# Patient Record
Sex: Female | Born: 1980 | Race: White | Hispanic: No | Marital: Single | Smoking: Former smoker
Health system: Southern US, Community
[De-identification: ages and names within clinical notes are randomized; demographics above are authoritative.]

## PROBLEM LIST (undated history)

## (undated) DIAGNOSIS — F419 Anxiety disorder, unspecified: Secondary | ICD-10-CM

## (undated) DIAGNOSIS — IMO0002 Reserved for concepts with insufficient information to code with codable children: Secondary | ICD-10-CM

## (undated) DIAGNOSIS — D649 Anemia, unspecified: Secondary | ICD-10-CM

## (undated) DIAGNOSIS — F329 Major depressive disorder, single episode, unspecified: Secondary | ICD-10-CM

## (undated) DIAGNOSIS — Z8489 Family history of other specified conditions: Secondary | ICD-10-CM

## (undated) DIAGNOSIS — R569 Unspecified convulsions: Secondary | ICD-10-CM

## (undated) DIAGNOSIS — J189 Pneumonia, unspecified organism: Secondary | ICD-10-CM

## (undated) DIAGNOSIS — F32A Depression, unspecified: Secondary | ICD-10-CM

## (undated) DIAGNOSIS — B351 Tinea unguium: Secondary | ICD-10-CM

## (undated) DIAGNOSIS — R519 Headache, unspecified: Secondary | ICD-10-CM

## (undated) DIAGNOSIS — K219 Gastro-esophageal reflux disease without esophagitis: Secondary | ICD-10-CM

## (undated) DIAGNOSIS — K5900 Constipation, unspecified: Secondary | ICD-10-CM

## (undated) HISTORY — PX: LEEP: SHX91

---

## 2004-04-01 ENCOUNTER — Emergency Department: Payer: Self-pay | Admitting: Emergency Medicine

## 2004-07-29 ENCOUNTER — Ambulatory Visit: Payer: Self-pay | Admitting: Physical Medicine & Rehabilitation

## 2006-12-09 ENCOUNTER — Other Ambulatory Visit: Payer: Self-pay

## 2006-12-09 ENCOUNTER — Emergency Department: Payer: Self-pay | Admitting: Emergency Medicine

## 2007-08-14 ENCOUNTER — Emergency Department: Payer: Self-pay | Admitting: Emergency Medicine

## 2007-11-28 ENCOUNTER — Emergency Department: Payer: Self-pay | Admitting: Emergency Medicine

## 2010-11-28 ENCOUNTER — Emergency Department: Payer: Self-pay | Admitting: Emergency Medicine

## 2011-08-26 ENCOUNTER — Emergency Department: Payer: Self-pay | Admitting: *Deleted

## 2011-08-26 LAB — URINALYSIS, COMPLETE
Bilirubin,UR: NEGATIVE
Blood: NEGATIVE
Glucose,UR: NEGATIVE mg/dL (ref 0–75)
Ketone: NEGATIVE
Leukocyte Esterase: NEGATIVE
Nitrite: NEGATIVE
Ph: 6 (ref 4.5–8.0)
Protein: NEGATIVE
RBC,UR: NONE SEEN /HPF (ref 0–5)
Specific Gravity: 1.027 (ref 1.003–1.030)
Squamous Epithelial: 9
WBC UR: NONE SEEN /HPF (ref 0–5)

## 2011-08-26 LAB — CBC WITH DIFFERENTIAL/PLATELET
Basophil %: 0.9 %
Eosinophil #: 0.5 10*3/uL (ref 0.0–0.7)
HGB: 13.7 g/dL (ref 12.0–16.0)
MCHC: 33.3 g/dL (ref 32.0–36.0)
Monocyte #: 0.5 x10 3/mm (ref 0.2–0.9)
Monocyte %: 4.5 %
Neutrophil #: 7.2 10*3/uL — ABNORMAL HIGH (ref 1.4–6.5)
Neutrophil %: 65.9 %
Platelet: 340 10*3/uL (ref 150–440)
RBC: 4.77 10*6/uL (ref 3.80–5.20)
RDW: 13.7 % (ref 11.5–14.5)

## 2011-08-26 LAB — BASIC METABOLIC PANEL
Calcium, Total: 8.8 mg/dL (ref 8.5–10.1)
Chloride: 103 mmol/L (ref 98–107)
EGFR (Non-African Amer.): >60

## 2011-08-26 LAB — PREGNANCY, URINE: Pregnancy Test, Urine: NEGATIVE m[IU]/mL

## 2014-07-09 DIAGNOSIS — R87613 High grade squamous intraepithelial lesion on cytologic smear of cervix (HGSIL): Secondary | ICD-10-CM | POA: Insufficient documentation

## 2014-07-09 DIAGNOSIS — K5909 Other constipation: Secondary | ICD-10-CM | POA: Insufficient documentation

## 2014-07-09 DIAGNOSIS — N930 Postcoital and contact bleeding: Secondary | ICD-10-CM | POA: Insufficient documentation

## 2014-07-09 DIAGNOSIS — N941 Unspecified dyspareunia: Secondary | ICD-10-CM | POA: Insufficient documentation

## 2014-11-03 ENCOUNTER — Other Ambulatory Visit: Payer: Self-pay | Admitting: Nurse Practitioner

## 2014-11-03 DIAGNOSIS — G8929 Other chronic pain: Secondary | ICD-10-CM

## 2014-11-03 DIAGNOSIS — R1013 Epigastric pain: Secondary | ICD-10-CM

## 2014-11-03 DIAGNOSIS — K5900 Constipation, unspecified: Secondary | ICD-10-CM

## 2014-11-03 DIAGNOSIS — R197 Diarrhea, unspecified: Secondary | ICD-10-CM

## 2014-11-06 ENCOUNTER — Ambulatory Visit: Admission: RE | Admit: 2014-11-06 | Payer: PRIVATE HEALTH INSURANCE | Source: Ambulatory Visit

## 2014-12-15 ENCOUNTER — Ambulatory Visit
Admission: RE | Admit: 2014-12-15 | Discharge: 2014-12-15 | Disposition: A | Discharge status: Home / Self Care | Payer: PRIVATE HEALTH INSURANCE | Source: Ambulatory Visit | Attending: Nurse Practitioner | Admitting: Nurse Practitioner

## 2014-12-15 DIAGNOSIS — R198 Other specified symptoms and signs involving the digestive system and abdomen: Secondary | ICD-10-CM | POA: Diagnosis present

## 2014-12-15 DIAGNOSIS — R1013 Epigastric pain: Secondary | ICD-10-CM | POA: Diagnosis present

## 2014-12-15 DIAGNOSIS — G8929 Other chronic pain: Secondary | ICD-10-CM | POA: Diagnosis not present

## 2014-12-15 DIAGNOSIS — R1030 Lower abdominal pain, unspecified: Secondary | ICD-10-CM | POA: Diagnosis not present

## 2014-12-15 DIAGNOSIS — K5900 Constipation, unspecified: Secondary | ICD-10-CM

## 2014-12-15 DIAGNOSIS — R197 Diarrhea, unspecified: Secondary | ICD-10-CM

## 2014-12-15 MED ORDER — IOHEXOL 300 MG/ML  SOLN
100.0000 mL | Freq: Once | INTRAMUSCULAR | Status: AC | PRN
  Administered 2014-12-15: 100 mL via INTRAVENOUS

## 2014-12-19 ENCOUNTER — Encounter: Payer: Self-pay | Admitting: *Deleted

## 2014-12-22 ENCOUNTER — Ambulatory Visit
Admission: RE | Admit: 2014-12-22 | Discharge: 2014-12-22 | Disposition: A | Discharge status: Home / Self Care | Payer: No Typology Code available for payment source | Source: Ambulatory Visit | Attending: Gastroenterology | Admitting: Gastroenterology

## 2014-12-22 ENCOUNTER — Ambulatory Visit: Payer: No Typology Code available for payment source | Admitting: Anesthesiology

## 2014-12-22 ENCOUNTER — Encounter: Payer: Self-pay | Admitting: *Deleted

## 2014-12-22 ENCOUNTER — Encounter
Admission: RE | Disposition: A | Discharge status: Home / Self Care | Payer: Self-pay | Source: Ambulatory Visit | Attending: Gastroenterology

## 2014-12-22 DIAGNOSIS — Z87891 Personal history of nicotine dependence: Secondary | ICD-10-CM | POA: Insufficient documentation

## 2014-12-22 DIAGNOSIS — F329 Major depressive disorder, single episode, unspecified: Secondary | ICD-10-CM | POA: Insufficient documentation

## 2014-12-22 DIAGNOSIS — Z79899 Other long term (current) drug therapy: Secondary | ICD-10-CM | POA: Diagnosis not present

## 2014-12-22 DIAGNOSIS — R1013 Epigastric pain: Secondary | ICD-10-CM | POA: Diagnosis not present

## 2014-12-22 DIAGNOSIS — K5900 Constipation, unspecified: Secondary | ICD-10-CM | POA: Insufficient documentation

## 2014-12-22 DIAGNOSIS — Z8371 Family history of colonic polyps: Secondary | ICD-10-CM | POA: Insufficient documentation

## 2014-12-22 DIAGNOSIS — K219 Gastro-esophageal reflux disease without esophagitis: Secondary | ICD-10-CM | POA: Insufficient documentation

## 2014-12-22 DIAGNOSIS — Z800 Family history of malignant neoplasm of digestive organs: Secondary | ICD-10-CM | POA: Insufficient documentation

## 2014-12-22 DIAGNOSIS — N941 Dyspareunia: Secondary | ICD-10-CM | POA: Insufficient documentation

## 2014-12-22 DIAGNOSIS — F419 Anxiety disorder, unspecified: Secondary | ICD-10-CM | POA: Diagnosis not present

## 2014-12-22 HISTORY — DX: Major depressive disorder, single episode, unspecified: F32.9

## 2014-12-22 HISTORY — DX: Reserved for concepts with insufficient information to code with codable children: IMO0002

## 2014-12-22 HISTORY — DX: Unspecified convulsions: R56.9

## 2014-12-22 HISTORY — PX: ESOPHAGOGASTRODUODENOSCOPY (EGD) WITH PROPOFOL: SHX5813

## 2014-12-22 HISTORY — DX: Anxiety disorder, unspecified: F41.9

## 2014-12-22 HISTORY — DX: Depression, unspecified: F32.A

## 2014-12-22 HISTORY — PX: COLONOSCOPY: SHX5424

## 2014-12-22 HISTORY — DX: Constipation, unspecified: K59.00

## 2014-12-22 LAB — POCT PREGNANCY, URINE: PREG TEST UR: NEGATIVE

## 2014-12-22 SURGERY — COLONOSCOPY
Anesthesia: General

## 2014-12-22 MED ORDER — GLYCOPYRROLATE 0.2 MG/ML IJ SOLN
INTRAMUSCULAR | Status: DC | PRN
  Administered 2014-12-22: 0.2 mg via INTRAVENOUS

## 2014-12-22 MED ORDER — SODIUM CHLORIDE 0.9 % IV SOLN
INTRAVENOUS | Status: DC
Start: 2014-12-22 — End: 2014-12-22
  Administered 2014-12-22: 13:00:00 via INTRAVENOUS

## 2014-12-22 MED ORDER — PROPOFOL INFUSION 10 MG/ML OPTIME
INTRAVENOUS | Status: DC | PRN
  Administered 2014-12-22: 250 ug/kg/min via INTRAVENOUS

## 2014-12-22 MED ORDER — LIDOCAINE HCL (CARDIAC) 20 MG/ML IV SOLN
INTRAVENOUS | Status: DC | PRN
  Administered 2014-12-22: 100 mg via INTRAVENOUS

## 2014-12-22 MED ORDER — PROPOFOL 10 MG/ML IV BOLUS
INTRAVENOUS | Status: DC | PRN
  Administered 2014-12-22 (×3): 50 mg via INTRAVENOUS

## 2014-12-22 MED ORDER — FENTANYL CITRATE (PF) 100 MCG/2ML IJ SOLN
INTRAMUSCULAR | Status: DC | PRN
  Administered 2014-12-22: 50 ug via INTRAVENOUS

## 2014-12-22 MED ORDER — MIDAZOLAM HCL 2 MG/2ML IJ SOLN
INTRAMUSCULAR | Status: DC | PRN
  Administered 2014-12-22: 2 mg via INTRAVENOUS

## 2014-12-22 MED ORDER — SODIUM CHLORIDE 0.9 % IV SOLN: INTRAVENOUS | Status: DC

## 2014-12-22 NOTE — Op Note (Addendum)
[facility] Gastroenterology Patient Name: Janet Summers Procedure Date: 12/22/2014 12:34 PM MRN: [id number] Account #: [id number] Date of Birth: 10-26-1980 Admit Type: Outpatient Age: 34 Room: [facility] ENDO ROOM 4 Gender: Female Note Status: Supervisor Override Procedure:         Colonoscopy Indications:       Family history of colon cancer in a first-degree relative,                     Family history of colonic polyps in a first-degree                     relative, Change in bowel habits Providers:         Ezzard Standing. Bluford Kaufmann, MD Referring MD:      Sallye Lat Md, MD (Referring MD) Medicines:         Monitored Anesthesia Care Complications:     No immediate complications. Procedure:         Pre-Anesthesia Assessment:                    - Prior to the procedure, a History and Physical was                     performed, and patient medications, allergies and                     sensitivities were reviewed. The patient's tolerance of                     previous anesthesia was reviewed.                    - The risks and benefits of the procedure and the sedation                     options and risks were discussed with the patient. All                     questions were answered and informed consent was obtained.                    After obtaining informed consent, the colonoscope was                     passed under direct vision. Throughout the procedure, the                     patient's blood pressure, pulse, and oxygen saturations                     were monitored continuously. The Colonoscope was                     introduced through the anus and advanced to the the                     terminal ileum, with identification of the appendiceal                     orifice and IC valve. The colonoscopy was performed                     without difficulty. The patient tolerated the procedure  well. The quality of the bowel preparation was good. Findings:   The colon (entire examined portion) appeared normal. Biopsies for       histology were taken with a cold forceps from the entire colon for       evaluation of microscopic colitis.      The terminal ileum appeared normal. Biopsies were taken with a cold       forceps for histology. Impression:        - The entire examined colon is normal. Biopsied.                    - The examined portion of the ileum was normal. Biopsied. Recommendation:    - Discharge patient to home.                    - Await pathology results.                    - Repeat colonoscopy in 10 years for surveillance.                    - The findings and recommendations were discussed with the                     patient. Procedure Code(s): --- Professional ---                    [phone], Colonoscopy, flexible; with biopsy, single or                     multiple Diagnosis Code(s): --- Professional ---                    Z80.0, Family history of malignant neoplasm of digestive                     organs                    Z83.71, Family history of colonic polyps                    R19.4, Change in bowel habit CPT copyright 2014 American Medical Association. All rights reserved. The codes documented in this report are preliminary and upon coder review may  be revised to meet current compliance requirements. Wallace Cullens, MD 12/22/2014 1:45:54 PM This report has been signed electronically. Number of Addenda: 0 Note Initiated On: 12/22/2014 12:34 PM Scope Withdrawal Time: 0 hours 4 minutes 29 seconds  Total Procedure Duration: 0 hours 8 minutes 58 seconds       [facility]

## 2014-12-22 NOTE — Anesthesia Preprocedure Evaluation (Signed)
Anesthesia Evaluation  Patient identified by MRN, date of birth, ID band Patient awake    Reviewed: Allergy & Precautions, H&P , NPO status , Patient's Chart, lab work & pertinent test results, reviewed documented beta blocker date and time   History of Anesthesia Complications Negative for: history of anesthetic complications  Airway Mallampati: III  TM Distance: >3 FB Neck ROM: full    Dental no notable dental hx. (+) Partial Upper, Missing   Pulmonary neg pulmonary ROS, former smoker,    Pulmonary exam normal breath sounds clear to auscultation       Cardiovascular Exercise Tolerance: Good negative cardio ROS Normal cardiovascular exam Rhythm:regular Rate:Normal     Neuro/Psych Seizures - (last one was 3 years ago), Well Controlled,  PSYCHIATRIC DISORDERS (depression and anxiety)    GI/Hepatic Neg liver ROS, GERD  Medicated and Controlled,  Endo/Other  negative endocrine ROS  Renal/GU negative Renal ROS  negative genitourinary   Musculoskeletal   Abdominal   Peds  Hematology negative hematology ROS (+)   Anesthesia Other Findings Past Medical History:   Seizures                                                     Depression                                                   Anxiety                                                      Constipation                                                 Dyspareunia                                                  Reproductive/Obstetrics negative OB ROS                             Anesthesia Physical Anesthesia Plan  ASA: II  Anesthesia Plan: General   Post-op Pain Management:    Induction:   Airway Management Planned:   Additional Equipment:   Intra-op Plan:   Post-operative Plan:   Informed Consent: I have reviewed the patients History and Physical, chart, labs and discussed the procedure including the risks, benefits and  alternatives for the proposed anesthesia with the patient or authorized representative who has indicated his/her understanding and acceptance.   Dental Advisory Given  Plan Discussed with: Anesthesiologist, CRNA and Surgeon  Anesthesia Plan Comments:         Anesthesia Quick Evaluation

## 2014-12-22 NOTE — Transfer of Care (Signed)
Immediate Anesthesia Transfer of Care Note  Patient: Janet Summers  Procedure(s) Performed: Procedure(s): COLONOSCOPY (N/A) ESOPHAGOGASTRODUODENOSCOPY (EGD) WITH PROPOFOL (N/A)  Patient Location: PACU  Anesthesia Type:General  Level of Consciousness: awake  Airway & Oxygen Therapy: Patient Spontanous Breathing and Patient connected to nasal cannula oxygen  Post-op Assessment: Report given to RN and Post -op Vital signs reviewed and stable  Post vital signs: Reviewed and stable  Last Vitals:  Filed Vitals:   12/22/14 1349  BP: 103/70  Pulse: 79  Temp: 36.1 C  Resp: 21    Complications: No apparent anesthesia complications

## 2014-12-22 NOTE — Op Note (Signed)
[facility] Gastroenterology Patient Name: Janet Summers Procedure Date: 12/22/2014 12:35 PM MRN: [id number] Account #: [id number] Date of Birth: 07-19-1980 Admit Type: Outpatient Age: 33 Room: [facility] ENDO ROOM 4 Gender: Female Note Status: Finalized Procedure:         Upper GI endoscopy Indications:       Epigastric abdominal pain, Suspected gastro-esophageal                     reflux disease, Symptoms bettr after starting prilosec. Providers:         Ezzard Standing. Bluford Kaufmann, MD Referring MD:      Sallye Lat Md, MD (Referring MD) Medicines:         Monitored Anesthesia Care Complications:     No immediate complications. Procedure:         Pre-Anesthesia Assessment:                    - Prior to the procedure, a History and Physical was                     performed, and patient medications, allergies and                     sensitivities were reviewed. The patient's tolerance of                     previous anesthesia was reviewed.                    - The risks and benefits of the procedure and the sedation                     options and risks were discussed with the patient. All                     questions were answered and informed consent was obtained.                    - After reviewing the risks and benefits, the patient was                     deemed in satisfactory condition to undergo the procedure.                    After obtaining informed consent, the endoscope was passed                     under direct vision. Throughout the procedure, the                     patient's blood pressure, pulse, and oxygen saturations                     were monitored continuously. The Olympus GIF-160 endoscope                     (S#. [id number]) was introduced through the mouth, and                     advanced to the second part of duodenum. The upper GI                     endoscopy was accomplished without difficulty. The patient  tolerated the procedure  well. Findings:      The examined esophagus was normal.      The entire examined stomach was normal.      The examined duodenum was normal. Impression:        - Normal esophagus.                    - Normal stomach.                    - Normal examined duodenum.                    - No specimens collected. Recommendation:    - Discharge patient to home.                    - Observe patient's clinical course.                    - Continue present medications.                    - The findings and recommendations were discussed with the                     patient. Procedure Code(s): --- Professional ---                    [phone], Esophagogastroduodenoscopy, flexible, transoral;                     diagnostic, including collection of specimen(s) by                     brushing or washing, when performed (separate procedure) Diagnosis Code(s): --- Professional ---                    R10.13, Epigastric pain CPT copyright 2014 American Medical Association. All rights reserved. The codes documented in this report are preliminary and upon coder review may  be revised to meet current compliance requirements. Wallace Cullens, MD 12/22/2014 1:32:51 PM This report has been signed electronically. Number of Addenda: 0 Note Initiated On: 12/22/2014 12:35 PM      [facility]

## 2014-12-22 NOTE — H&P (Signed)
    Primary Care Physician:  Cyndie Mull, DO Primary Gastroenterologist:  Dr. Bluford Kaufmann  Pre-Procedure History & Physical: HPI:  Janet Summers is a 33 y.o. female is here for an EGD/colonoscopy.   Past Medical History  Diagnosis Date  . Seizures   . Depression   . Anxiety   . Constipation   . Dyspareunia     Past Surgical History  Procedure Laterality Date  . Leep      Prior to Admission medications   Medication Sig Start Date End Date Taking? Authorizing Provider  omeprazole (PRILOSEC OTC) 20 MG tablet Take 20 mg by mouth daily.   Yes Historical Provider, MD    Allergies as of 11/20/2014  . (Not on File)    History reviewed. No pertinent family history.  Social History   Social History  . Marital Status: Married    Spouse Name: N/A  . Number of Children: N/A  . Years of Education: N/A   Occupational History  . Not on file.   Social History Main Topics  . Smoking status: Former Games developer  . Smokeless tobacco: Never Used  . Alcohol Use: No  . Drug Use: No  . Sexual Activity: Not on file   Other Topics Concern  . Not on file   Social History Narrative    Review of Systems: See HPI, otherwise negative ROS  Physical Exam: BP 111/67 mmHg  Pulse 71  Temp(Src) 97.4 F (36.3 C) (Tympanic)  Resp 10  Ht  (1.626 m)  Wt 86.183 kg (190 lb)  BMI 32.60 kg/m2  SpO2 100%  LMP  General:   Alert,  pleasant and cooperative in NAD Head:  Normocephalic and atraumatic. Neck:  Supple; no masses or thyromegaly. Lungs:  Clear throughout to auscultation.    Heart:  Regular rate and rhythm. Abdomen:  Soft, nontender and nondistended. Normal bowel sounds, without guarding, and without rebound.   Neurologic:  Alert and  oriented x4;  grossly normal neurologically.  Impression/Plan: Janet Summers is here for an EGD/colonoscopy to be performed for family hx of colon cancer/polyps, GERD, and epigastric pain.  Risks, benefits, limitations, and alternatives  regarding EGD/colonoscopy have been reviewed with the patient.  Questions have been answered.  All parties agreeable.   OH, Ezzard Standing, MD  12/22/2014, 1:22 PM

## 2014-12-23 LAB — SURGICAL PATHOLOGY

## 2014-12-23 NOTE — Anesthesia Postprocedure Evaluation (Signed)
  Anesthesia Post-op Note  Patient: Janet Summers  Procedure(s) Performed: Procedure(s): COLONOSCOPY (N/A) ESOPHAGOGASTRODUODENOSCOPY (EGD) WITH PROPOFOL (N/A)  Anesthesia type:General  Patient location: PACU  Post pain: Pain level controlled  Post assessment: Post-op Vital signs reviewed, Patient's Cardiovascular Status Stable, Respiratory Function Stable, Patent Airway and No signs of Nausea or vomiting  Post vital signs: Reviewed and stable  Last Vitals:  Filed Vitals:   12/22/14 1419  BP: 118/78  Pulse: 74  Temp:   Resp: 15    Level of consciousness: awake, alert  and patient cooperative  Complications: No apparent anesthesia complications

## 2014-12-24 ENCOUNTER — Encounter: Payer: Self-pay | Admitting: Gastroenterology

## 2015-05-25 ENCOUNTER — Encounter: Payer: Self-pay | Admitting: Emergency Medicine

## 2015-05-25 ENCOUNTER — Ambulatory Visit
Admission: EM | Admit: 2015-05-25 | Discharge: 2015-05-25 | Disposition: A | Discharge status: Home / Self Care | Payer: BLUE CROSS/BLUE SHIELD | Attending: Family Medicine | Admitting: Family Medicine

## 2015-05-25 DIAGNOSIS — B349 Viral infection, unspecified: Secondary | ICD-10-CM

## 2015-05-25 LAB — RAPID INFLUENZA A&B ANTIGENS
Influenza A (ARMC): NOT DETECTED
Influenza B (ARMC): NOT DETECTED

## 2015-05-25 MED ORDER — LORATADINE-PSEUDOEPHEDRINE ER 5-120 MG PO TB12: 1.0000 | ORAL_TABLET | Freq: Two times a day (BID) | ORAL | Status: DC

## 2015-05-25 MED ORDER — OSELTAMIVIR PHOSPHATE 75 MG PO CAPS: 75.0000 mg | ORAL_CAPSULE | Freq: Two times a day (BID) | ORAL | Status: DC

## 2015-05-25 NOTE — Discharge Instructions (Signed)
Take medication as prescribed. Rest. Drink plenty of fluids. Take over the counter tylenol or ibuprofen as needed for pain or fever.  ° °Follow up with your primary care physician this week as needed. Return to Urgent care for new or worsening concerns.  ° °Viral Infections °A viral infection can be caused by different types of viruses. Most viral infections are not serious and resolve on their own. However, some infections may cause severe symptoms and may lead to further complications. °SYMPTOMS °Viruses can frequently cause: °· Minor sore throat. °· Aches and pains. °· Headaches. °· Runny nose. °· Different types of rashes. °· Watery eyes. °· Tiredness. °· Cough. °· Loss of appetite. °· Gastrointestinal infections, resulting in nausea, vomiting, and diarrhea. °These symptoms do not respond to antibiotics because the infection is not caused by bacteria. However, you might catch a bacterial infection following the viral infection. This is sometimes called a "superinfection." Symptoms of such a bacterial infection may include: °· Worsening sore throat with pus and difficulty swallowing. °· Swollen neck glands. °· Chills and a high or persistent fever. °· Severe headache. °· Tenderness over the sinuses. °· Persistent overall ill feeling (malaise), muscle aches, and tiredness (fatigue). °· Persistent cough. °· Yellow, green, or brown mucus production with coughing. °HOME CARE INSTRUCTIONS  °· Only take over-the-counter or prescription medicines for pain, discomfort, diarrhea, or fever as directed by your caregiver. °· Drink enough water and fluids to keep your urine clear or pale yellow. Sports drinks can provide valuable electrolytes, sugars, and hydration. °· Get plenty of rest and maintain proper nutrition. Soups and broths with crackers or rice are fine. °SEEK IMMEDIATE MEDICAL CARE IF:  °· You have severe headaches, shortness of breath, chest pain, neck pain, or an unusual rash. °· You have uncontrolled vomiting,  diarrhea, or you are unable to keep down fluids. °· You or your child has an oral temperature above 102° F (38.9° C), not controlled by medicine. °· Your baby is older than 3 months with a rectal temperature of 102° F (38.9° C) or higher. °· Your baby is 3 months old or younger with a rectal temperature of 100.4° F (38° C) or higher. °MAKE SURE YOU:  °· Understand these instructions. °· Will watch your condition. °· Will get help right away if you are not doing well or get worse. °  °This information is not intended to replace advice given to you by your health care provider. Make sure you discuss any questions you have with your health care provider. °  °Document Released: 12/29/2004 Document Revised: 06/13/2011 Document Reviewed: 08/27/2014 °Elsevier Interactive Patient Education ©2016 Elsevier Inc. ° °

## 2015-05-25 NOTE — ED Notes (Signed)
Patient c/o cough, chest congestion, and nasal congestion, and bodyaches since Friday.

## 2015-05-25 NOTE — ED Provider Notes (Signed)
[facility]  ____________________________________________  Time seen: Approximately 4:56 PM  I have reviewed the triage vital signs and the nursing notes.   HISTORY  Chief Complaint Cough and Nasal Congestion   HPI Janet Summers is a 35 y.o. female presents for complaints of 3 days of runny nose, nasal congestion, cough, body aches and fever. States Friday night fever of 102 orally, States since then fever highest at 100 degrees. States recently exposed to multiple coworkers that tested positive for influenza this weekend. Reports continues to eat and drink fluids well.   Denies chest pain, shortness of breath, abdominal pain, nausea, vomiting, diarrhea, constipation, dysuria, vaginal complaints.     Past Medical History  Diagnosis Date  . Seizures (HCC)   . Depression   . Anxiety   . Constipation   . Dyspareunia    Reports IUD in place, denies chance of pregnancy.   There are no active problems to display for this patient.   Past Surgical History  Procedure Laterality Date  . Leep    . Colonoscopy N/A 12/22/2014    Procedure: COLONOSCOPY;  Surgeon: Wallace Cullens, MD;  Location: [facility] ENDOSCOPY;  Service: Gastroenterology;  Laterality: N/A;  . Esophagogastroduodenoscopy (egd) with propofol N/A 12/22/2014    Procedure: ESOPHAGOGASTRODUODENOSCOPY (EGD) WITH PROPOFOL;  Surgeon: Wallace Cullens, MD;  Location: [facility] ENDOSCOPY;  Service: Gastroenterology;  Laterality: N/A;    Current Outpatient Rx  Name  Route  Sig  Dispense  Refill  . levonorgestrel (MIRENA) 20 MCG/24HR IUD   Intrauterine   1 each by Intrauterine route once.         Marland Kitchen omeprazole (PRILOSEC OTC) 20 MG tablet   Oral   Take 20 mg by mouth daily.           Allergies Review of patient's allergies indicates no known allergies.  History reviewed. No pertinent family history.  Social History Social History  Substance Use Topics  . Smoking status: Former Games developer  . Smokeless tobacco: Never Used  .  Alcohol Use: No    Review of Systems Constitutional: positive subjective fevers Eyes: No visual changes. ENT: No sore throat. Positive runny nose, nasal congestion, cough. Cardiovascular: Denies chest pain. Respiratory: Denies shortness of breath. Gastrointestinal: No abdominal pain.  No nausea, no vomiting.  No diarrhea.  No constipation. Genitourinary: Negative for dysuria. Musculoskeletal: Negative for back pain. Skin: Negative for rash. Neurological: Negative for headaches, focal weakness or numbness.  10-point ROS otherwise negative.  ____________________________________________   PHYSICAL EXAM:  VITAL SIGNS: ED Triage Vitals  Enc Vitals Group     BP 05/25/15 1521 128/77 mmHg     Pulse Rate 05/25/15 1521 82     Resp 05/25/15 1521 16     Temp 05/25/15 1521 98.2 F (36.8 C)     Temp Source 05/25/15 1521 Tympanic     SpO2 05/25/15 1521 100 %     Weight 05/25/15 1521 200 lb (90.719 kg)     Height 05/25/15 1521  (1.626 m)     Head Cir --      Peak Flow --      Pain Score 05/25/15 1523 4     Pain Loc --      Pain Edu? --      Excl. in GC? --     Constitutional: Alert and oriented. Well appearing and in no acute distress. Eyes: Conjunctivae are normal. PERRL. EOMI. Head: Atraumatic. No sinus tenderness to palpation. No swelling. No erythema.  Ears: no erythema, normal TMs bilaterally.   Nose: Nasal congestion with clear rhinorrhea.  Mouth/Throat: Mucous membranes are moist.  Oropharynx non-erythematous. No tonsillar swelling or exudate. Neck: No stridor.  No cervical spine tenderness to palpation. Hematological/Lymphatic/Immunilogical: No cervical lymphadenopathy. Cardiovascular: Normal rate, regular rhythm. Grossly normal heart sounds.  Good peripheral circulation. Respiratory: Normal respiratory effort.  No retractions. Lungs CTAB. No wheezes, rales or rhonchi. Good air movement. Gastrointestinal: Soft and nontender. No distention. Normal Bowel sounds.  No CVA  tenderness. Musculoskeletal: No lower or upper extremity tenderness nor edema. No calf tenderness bilaterally. No cervical, thoracic or lumbar tenderness to palpation. Neurologic:  Normal speech and language. No gross focal neurologic deficits are appreciated. No gait instability. Skin:  Skin is warm, dry and intact. No rash noted. Psychiatric: Mood and affect are normal. Speech and behavior are normal.  ____________________________________________   LABS (all labs ordered are listed, but only abnormal results are displayed)  Labs Reviewed  RAPID INFLUENZA A&B ANTIGENS ([facility] ONLY)     INITIAL IMPRESSION / ASSESSMENT AND PLAN / ED COURSE  Pertinent labs & imaging results that were available during my care of the patient were reviewed by me and considered in my medical decision making (see chart for details).  Very well-appearing patient. No acute distress. Presents for the complaints of to 3 days of runny nose, nasal congestion, cough, body aches and intermittent fevers. Recent positive exposure to influenza by coworkers. Lungs clear throughout. Abdomen soft and nontender. well-appearing patient. Suspect viral upper respiratory infection such as influenza. Influenza test negative however with recent exposure and clinical appearance will treat with oral Tamiflu, Claritin-D, when necessary over-the-counter ibuprofen. Encouraged rest, fluids and PCP follow-up. Work and school note given for today and tomorrow.  Discussed follow up with Primary care physician this week. Discussed follow up and return parameters including no resolution or any worsening concerns. Patient verbalized understanding and agreed to plan.   ____________________________________________   FINAL CLINICAL IMPRESSION(S) / ED DIAGNOSES  Final diagnoses:  Viral illness      Note: This dictation was prepared with Dragon dictation along with smaller phrase technology. Any transcriptional errors that result from this  process are unintentional.    Renford Dills, NP 05/25/15 [phone]

## 2019-02-18 DIAGNOSIS — G40909 Epilepsy, unspecified, not intractable, without status epilepticus: Secondary | ICD-10-CM | POA: Insufficient documentation

## 2020-02-09 ENCOUNTER — Emergency Department: Payer: BLUE CROSS/BLUE SHIELD

## 2020-02-09 ENCOUNTER — Emergency Department
Admission: EM | Admit: 2020-02-09 | Discharge: 2020-02-09 | Disposition: A | Discharge status: Home / Self Care | Payer: BLUE CROSS/BLUE SHIELD | Attending: Emergency Medicine | Admitting: Emergency Medicine

## 2020-02-09 ENCOUNTER — Other Ambulatory Visit: Payer: Self-pay

## 2020-02-09 DIAGNOSIS — S0083XA Contusion of other part of head, initial encounter: Secondary | ICD-10-CM | POA: Insufficient documentation

## 2020-02-09 DIAGNOSIS — Z79899 Other long term (current) drug therapy: Secondary | ICD-10-CM | POA: Insufficient documentation

## 2020-02-09 DIAGNOSIS — W19XXXA Unspecified fall, initial encounter: Secondary | ICD-10-CM | POA: Insufficient documentation

## 2020-02-09 DIAGNOSIS — R569 Unspecified convulsions: Secondary | ICD-10-CM | POA: Insufficient documentation

## 2020-02-09 DIAGNOSIS — Z5321 Procedure and treatment not carried out due to patient leaving prior to being seen by health care provider: Secondary | ICD-10-CM | POA: Insufficient documentation

## 2020-02-09 LAB — CBC
HCT: 43.5 % (ref 36.0–46.0)
Hemoglobin: 14.8 g/dL (ref 12.0–15.0)
MCH: 29.4 pg (ref 26.0–34.0)
MCHC: 34 g/dL (ref 30.0–36.0)
MCV: 86.3 fL (ref 80.0–100.0)
Platelets: 421 10*3/uL — ABNORMAL HIGH (ref 150–400)
RBC: 5.04 MIL/uL (ref 3.87–5.11)
RDW: 12.7 % (ref 11.5–15.5)
WBC: 13.9 10*3/uL — ABNORMAL HIGH (ref 4.0–10.5)
nRBC: 0 % (ref 0.0–0.2)

## 2020-02-09 LAB — BASIC METABOLIC PANEL
Anion gap: 9 (ref 5–15)
BUN: 10 mg/dL (ref 6–20)
CO2: 24 mmol/L (ref 22–32)
Calcium: 8.8 mg/dL — ABNORMAL LOW (ref 8.9–10.3)
Chloride: 106 mmol/L (ref 98–111)
Creatinine, Ser: 0.82 mg/dL (ref 0.44–1.00)
GFR, Estimated: >60 mL/min (ref >60)
Glucose, Bld: 108 mg/dL — ABNORMAL HIGH (ref 70–99)
Potassium: 3.7 mmol/L (ref 3.5–5.1)
Sodium: 139 mmol/L (ref 135–145)

## 2020-02-09 NOTE — ED Triage Notes (Signed)
Patient to triage via wheelchair by EMS.  EMS reports that friends witnessed 4 seizures tonight.  EMS reports patient was not postical on scene.  Patient takes lamictal for seizures, and has a hematoma to forehead from possible fall.  EMS CBG 110.    Patient in restroom with friend just prior to triage and is noted to be very unsteady on feet.

## 2020-02-09 NOTE — ED Notes (Addendum)
Pt requesting to leave. States needs to get home to her kids. IV removed. Pt has friend to transport home

## 2020-02-10 ENCOUNTER — Telehealth: Payer: Self-pay | Admitting: Emergency Medicine

## 2020-02-10 NOTE — Telephone Encounter (Signed)
Called patient due to left emergency department before provider exam to inquire about condition and follow up plans.  She has seen her labs in [place] and has notified her doctor of the seizures.

## 2020-05-17 ENCOUNTER — Emergency Department: Payer: 59

## 2020-05-17 ENCOUNTER — Other Ambulatory Visit: Payer: Self-pay

## 2020-05-17 ENCOUNTER — Emergency Department
Admission: EM | Admit: 2020-05-17 | Discharge: 2020-05-17 | Disposition: A | Discharge status: Home / Self Care | Payer: 59 | Attending: Emergency Medicine | Admitting: Emergency Medicine

## 2020-05-17 DIAGNOSIS — Z87891 Personal history of nicotine dependence: Secondary | ICD-10-CM | POA: Diagnosis not present

## 2020-05-17 DIAGNOSIS — F10920 Alcohol use, unspecified with intoxication, uncomplicated: Secondary | ICD-10-CM

## 2020-05-17 DIAGNOSIS — F10129 Alcohol abuse with intoxication, unspecified: Secondary | ICD-10-CM | POA: Diagnosis not present

## 2020-05-17 DIAGNOSIS — Z20822 Contact with and (suspected) exposure to covid-19: Secondary | ICD-10-CM | POA: Insufficient documentation

## 2020-05-17 DIAGNOSIS — R569 Unspecified convulsions: Secondary | ICD-10-CM | POA: Insufficient documentation

## 2020-05-17 LAB — CBC WITH DIFFERENTIAL/PLATELET
Abs Immature Granulocytes: 0.03 10*3/uL (ref 0.00–0.07)
Basophils Absolute: 0.1 10*3/uL (ref 0.0–0.1)
Basophils Relative: 1 %
Eosinophils Absolute: 0.2 10*3/uL (ref 0.0–0.5)
Eosinophils Relative: 2 %
HCT: 42.2 % (ref 36.0–46.0)
Hemoglobin: 13.8 g/dL (ref 12.0–15.0)
Immature Granulocytes: 0 %
Lymphocytes Relative: 33 %
Lymphs Abs: 3.8 10*3/uL (ref 0.7–4.0)
MCH: 28.6 pg (ref 26.0–34.0)
MCHC: 32.7 g/dL (ref 30.0–36.0)
MCV: 87.4 fL (ref 80.0–100.0)
Monocytes Absolute: 0.6 10*3/uL (ref 0.1–1.0)
Monocytes Relative: 6 %
Neutro Abs: 6.8 10*3/uL (ref 1.7–7.7)
Neutrophils Relative %: 58 %
Platelets: 321 10*3/uL (ref 150–400)
RBC: 4.83 MIL/uL (ref 3.87–5.11)
RDW: 12.3 % (ref 11.5–15.5)
WBC: 11.5 10*3/uL — ABNORMAL HIGH (ref 4.0–10.5)
nRBC: 0 % (ref 0.0–0.2)

## 2020-05-17 LAB — RESP PANEL BY RT-PCR (FLU A&B, COVID) ARPGX2
Influenza A by PCR: NEGATIVE
Influenza B by PCR: NEGATIVE
SARS Coronavirus 2 by RT PCR: NEGATIVE

## 2020-05-17 LAB — COMPREHENSIVE METABOLIC PANEL
ALT: 21 U/L (ref 0–44)
AST: 31 U/L (ref 15–41)
Albumin: 4.2 g/dL (ref 3.5–5.0)
Alkaline Phosphatase: 68 U/L (ref 38–126)
Anion gap: 9 (ref 5–15)
BUN: 7 mg/dL (ref 6–20)
CO2: 22 mmol/L (ref 22–32)
Calcium: 8.6 mg/dL — ABNORMAL LOW (ref 8.9–10.3)
Chloride: 108 mmol/L (ref 98–111)
Creatinine, Ser: 0.72 mg/dL (ref 0.44–1.00)
GFR, Estimated: >60 mL/min (ref >60)
Glucose, Bld: 94 mg/dL (ref 70–99)
Potassium: 4.4 mmol/L (ref 3.5–5.1)
Sodium: 139 mmol/L (ref 135–145)
Total Bilirubin: 0.8 mg/dL (ref 0.3–1.2)
Total Protein: 7.4 g/dL (ref 6.5–8.1)

## 2020-05-17 LAB — HCG, QUANTITATIVE, PREGNANCY: hCG, Beta Chain, Quant, S: <1 m[IU]/mL (ref <5)

## 2020-05-17 LAB — ETHANOL: Alcohol, Ethyl (B): 160 mg/dL — ABNORMAL HIGH (ref <10)

## 2020-05-17 MED ORDER — LAMOTRIGINE 25 MG PO TABS
50.0000 mg | ORAL_TABLET | Freq: Once | ORAL | Status: AC
  Administered 2020-05-17: 50 mg via ORAL
  Filled 2020-05-17: qty 2

## 2020-05-17 MED ORDER — LORAZEPAM 2 MG/ML IJ SOLN
1.0000 mg | Freq: Once | INTRAMUSCULAR | Status: AC
  Administered 2020-05-17: 1 mg via INTRAVENOUS
  Filled 2020-05-17: qty 1

## 2020-05-17 MED ORDER — SODIUM CHLORIDE 0.9 % IV BOLUS
1000.0000 mL | Freq: Once | INTRAVENOUS | Status: AC
  Administered 2020-05-17: 1000 mL via INTRAVENOUS

## 2020-05-17 NOTE — ED Triage Notes (Signed)
Pt presents to ER via ems [facility] following 4 seizures, each lasting appx 90 seconds.  Pt takes lamictal for seizures and has recently had her dose increased to TID but has only been taking it BID.  Pt A&O x4 at this time and c/o generalized body aches.

## 2020-05-17 NOTE — Discharge Instructions (Signed)
Take Lamictal at the dose and frequency as directed by your doctor.  Do not drink alcohol as this lowers your seizure threshold and makes it more likely that you may have a seizure.  Do not drive or operate heavy machinery until cleared by your doctor.  Return to the ER for worsening symptoms, persistent vomiting, difficulty breathing or other concerns.

## 2020-05-17 NOTE — ED Provider Notes (Signed)
[facility] Emergency Department Provider Note   ____________________________________________   Event Date/Time   First MD Initiated Contact with Patient 05/17/20 [phone]     (approximate)  I have reviewed the triage vital signs and the nursing notes.   HISTORY  Chief Complaint Seizures  Level of V caveat: Limited by postictal state  HPI Janet Summers is a 40 y.o. female brought to the ED via EMS from [facility] with a chief complaint of recurrent seizures.  Patient has seizure disorder who takes Lamictal.  Sounds like her Lamictal was recently increased to 3 times daily but patient is taking 2 tablets twice daily.  Reportedly had 2 seizures while in a booth at the [facility]  EMS witnessed a third, tonic-clonic seizure lasting less than 90 seconds.  Patient refused EMS transport.  EMS was called out again for patient collapsing in the parking lot with a fourth seizure.  She did not strike her head as she was caught by a friend.  Patient arrives to the ED post ictal but alert enough to participate in history and interview.  Denies recent fever, cough, chest pain, shortness of breath, abdominal pain, nausea or vomiting.  Admits to EtOH tonight.     Past Medical History:  Diagnosis Date  . Anxiety   . Constipation   . Depression   . Dyspareunia   . Seizures (HCC)     There are no problems to display for this patient.   Past Surgical History:  Procedure Laterality Date  . COLONOSCOPY N/A 12/22/2014   Procedure: COLONOSCOPY;  Surgeon: Wallace Cullens, MD;  Location: [facility] ENDOSCOPY;  Service: Gastroenterology;  Laterality: N/A;  . ESOPHAGOGASTRODUODENOSCOPY (EGD) WITH PROPOFOL N/A 12/22/2014   Procedure: ESOPHAGOGASTRODUODENOSCOPY (EGD) WITH PROPOFOL;  Surgeon: Wallace Cullens, MD;  Location: [facility] ENDOSCOPY;  Service: Gastroenterology;  Laterality: N/A;  . LEEP      Prior to Admission medications   Medication Sig Start Date End Date Taking? Authorizing  Provider  levonorgestrel (MIRENA) 20 MCG/24HR IUD 1 each by Intrauterine route once.    [provider]  loratadine-pseudoephedrine (CLARITIN-D 12 HOUR) 5-120 MG tablet Take 1 tablet by mouth 2 (two) times daily. 05/25/15   Renford Dills, NP  omeprazole (PRILOSEC OTC) 20 MG tablet Take 20 mg by mouth daily.    [provider]  oseltamivir (TAMIFLU) 75 MG capsule Take 1 capsule (75 mg total) by mouth every 12 (twelve) hours. 05/25/15   Renford Dills, NP    Allergies Patient has no known allergies.  History reviewed. No pertinent family history.  Social History Social History   Tobacco Use  . Smoking status: Former Games developer  . Smokeless tobacco: Never Used  Substance Use Topics  . Alcohol use: No  . Drug use: No    Review of Systems  Constitutional: No fever/chills Eyes: No visual changes. ENT: No sore throat. Cardiovascular: Denies chest pain. Respiratory: Denies shortness of breath. Gastrointestinal: No abdominal pain.  No nausea, no vomiting.  No diarrhea.  No constipation. Genitourinary: Negative for dysuria. Musculoskeletal: Negative for back pain. Skin: Negative for rash. Neurological: Positive for recurrent seizures.  Negative for headaches, focal weakness or numbness.   ____________________________________________   PHYSICAL EXAM:  VITAL SIGNS: ED Triage Vitals  Enc Vitals Group     BP      Pulse      Resp      Temp      Temp src      SpO2  Weight      Height      Head Circumference      Peak Flow      Pain Score      Pain Loc      Pain Edu?      Excl. in GC?     Constitutional: Alert and oriented.  Postictal appearing and in no acute distress. Eyes: Conjunctivae are normal. PERRL. EOMI. Head: Atraumatic. Nose: No congestion/rhinnorhea. Mouth/Throat: Mucous membranes are moist.  No tongue laceration.   Neck: No stridor.  No cervical spine tenderness to palpation. Cardiovascular: Normal rate, regular rhythm. Grossly normal  heart sounds.  Good peripheral circulation. Respiratory: Normal respiratory effort.  No retractions. Lungs CTAB. Gastrointestinal: Soft and nontender. No distention. No abdominal bruits. No CVA tenderness. Musculoskeletal: No lower extremity tenderness nor edema.  No joint effusions. Neurologic: Alert and oriented x3.  Normal speech and language. No gross focal neurologic deficits are appreciated. MAEx4. Skin:  Skin is warm, dry and intact. No rash noted. Psychiatric: Mood and affect are normal. Speech and behavior are normal.  ____________________________________________   LABS (all labs ordered are listed, but only abnormal results are displayed)  Labs Reviewed  CBC WITH DIFFERENTIAL/PLATELET - Abnormal; Notable for the following components:      Result Value   WBC 11.5 (*)    All other components within normal limits  ETHANOL - Abnormal; Notable for the following components:   Alcohol, Ethyl (B) 160 (*)    All other components within normal limits  COMPREHENSIVE METABOLIC PANEL - Abnormal; Notable for the following components:   Calcium 8.6 (*)    All other components within normal limits  RESP PANEL BY RT-PCR (FLU A&B, COVID) ARPGX2  HCG, QUANTITATIVE, PREGNANCY  URINALYSIS, COMPLETE (UACMP) WITH MICROSCOPIC  URINE DRUG SCREEN, QUALITATIVE ([facility] ONLY)  LAMOTRIGINE LEVEL   ____________________________________________  EKG  ED ECG REPORT I, Willa Brocks J, the attending physician, personally viewed and interpreted this ECG.   Date: 05/17/2020  EKG Time: 0218  Rate: 107  Rhythm: sinus tachycardia  Axis: Normal  Intervals:none  ST&T Change: Nonspecific  ____________________________________________  RADIOLOGY I, Maximillion Gill J, personally viewed and evaluated these images (plain radiographs) as part of my medical decision making, as well as reviewing the written report by the radiologist.  ED MD interpretation: No ICH, no acute cardiopulmonary process  Official radiology  report(s): CT Head Wo Contrast  Result Date: 05/17/2020 CLINICAL DATA:  Seizure, nontraumatic. EXAM: CT HEAD WITHOUT CONTRAST TECHNIQUE: Contiguous axial images were obtained from the base of the skull through the vertex without intravenous contrast. COMPARISON:  CT head 04/26/2001 report without images. CT head 02/09/2020 FINDINGS: Brain: No evidence of large-territorial acute infarction. No parenchymal hemorrhage. No mass lesion. No extra-axial collection. No mass effect or midline shift. No hydrocephalus. Basilar cisterns are patent. Vascular: No hyperdense vessel. Skull: No acute fracture or focal lesion. Sinuses/Orbits: Paranasal sinuses and mastoid air cells are clear. The orbits are unremarkable. Other: None. IMPRESSION: No acute intracranial abnormality. Electronically Signed   By: Tish Frederickson M.D.   On: 05/17/2020 03:13   DG Chest Port 1 View  Result Date: 05/17/2020 CLINICAL DATA:  Recurrent seizures. EXAM: PORTABLE CHEST 1 VIEW COMPARISON:  None. FINDINGS: The cardiomediastinal contours are normal. The lungs are clear. Pulmonary vasculature is normal. No consolidation, pleural effusion, or pneumothorax. No acute osseous abnormalities are seen. IMPRESSION: Negative AP view of the chest. Electronically Signed   By: Narda Rutherford M.D.   On: 05/17/2020 03:10  ____________________________________________   PROCEDURES  Procedure(s) performed (including Critical Care):  .1-3 Lead EKG Interpretation Performed by: Irean Hong, MD Authorized by: Irean Hong, MD     Interpretation: abnormal     ECG rate:  107   ECG rate assessment: tachycardic     Rhythm: sinus tachycardia     Ectopy: none     Conduction: normal   Comments:     Patient placed on cardiac monitor to evaluate for arrhythmias     ____________________________________________   INITIAL IMPRESSION / ASSESSMENT AND PLAN / ED COURSE  As part of my medical decision making, I reviewed the following data within  the electronic MEDICAL RECORD NUMBER Nursing notes reviewed and incorporated, Labs reviewed, EKG interpreted, Old chart reviewed, Radiograph reviewed and Notes from prior ED visits     40 year old female presenting with recurrent seizures Differential diagnosis includes, but is not limited to, alcohol, illicit or prescription medications, or other toxic ingestion; intracranial pathology such as stroke or intracerebral hemorrhage; fever or infectious causes including sepsis; hypoxemia and/or hypercarbia; uremia; trauma; endocrine related disorders such as diabetes, hypoglycemia, and thyroid-related diseases; hypertensive encephalopathy; etc.  Will obtain lab work, CT head.  Initiate IV fluid resuscitation, 1 mg IV Ativan to prevent further seizure.  Will dose Lamictal.  Anticipate hospitalization.  Clinical Course as of 05/17/20 6301  Wynelle Link May 17, 2020  6010 Patient asleep, boyfriend at bedside.  Awaken patient to update them of laboratory and imaging results.  I did recommend that patient be hospitalized due to recurrent seizures tonight.  Patient adamantly desires to go home to her kids.  I personally reviewed patient's old chart and see that Dr. Dyke Brackett, her PCP, has been managing her antiepileptics.  In his office note dated 04/2019, he did mention that she would need neurology if she had further seizures.  Will refer to [facility] neurology.  Strict return precautions given.  Both verbalized understanding and agree with plan of care. [JS]  [id number] Patient was able to tolerate water, much more awake and alert now and ready to go home. Again offered hospitalization for recurrent seizures but patient declines. [JS]    Clinical Course User Index [JS] Irean Hong, MD     ____________________________________________   FINAL CLINICAL IMPRESSION(S) / ED DIAGNOSES  Final diagnoses:  Seizure [facility])  Alcoholic intoxication without complication [facility])     ED Discharge Orders    None      *Please note:  Janet Summers was evaluated in Emergency Department on 05/17/2020 for the symptoms described in the history of present illness. She was evaluated in the context of the global COVID-19 pandemic, which necessitated consideration that the patient might be at risk for infection with the SARS-CoV-2 virus that causes COVID-19. Institutional protocols and algorithms that pertain to the evaluation of patients at risk for COVID-19 are in a state of rapid change based on information released by regulatory bodies including the CDC and federal and state organizations. These policies and algorithms were followed during the patient's care in the ED.  Some ED evaluations and interventions may be delayed as a result of limited staffing during and the pandemic.*   Note:  This document was prepared using Dragon voice recognition software and may include unintentional dictation errors.   Irean Hong, MD 05/17/20 [phone]

## 2020-05-18 LAB — LAMOTRIGINE LEVEL: Lamotrigine Lvl: 1.1 ug/mL — ABNORMAL LOW (ref 2.0–20.0)

## 2021-11-18 ENCOUNTER — Other Ambulatory Visit: Payer: Self-pay | Admitting: Obstetrics and Gynecology

## 2021-11-18 DIAGNOSIS — Z1231 Encounter for screening mammogram for malignant neoplasm of breast: Secondary | ICD-10-CM

## 2021-12-30 ENCOUNTER — Ambulatory Visit
Admission: RE | Admit: 2021-12-30 | Discharge: 2021-12-30 | Disposition: A | Discharge status: Home / Self Care | Payer: 59 | Source: Ambulatory Visit | Attending: Obstetrics and Gynecology | Admitting: Obstetrics and Gynecology

## 2021-12-30 DIAGNOSIS — Z1231 Encounter for screening mammogram for malignant neoplasm of breast: Secondary | ICD-10-CM | POA: Insufficient documentation

## 2022-02-22 IMAGING — CT CT HEAD W/O CM
3 series · 16 of 47 positions shown, 19 images · non-contrast
Comparison: CT head 04/26/2001 report without images. CT head
02/09/2020

CLINICAL DATA: Seizure, nontraumatic.

EXAM:
CT HEAD WITHOUT CONTRAST
TECHNIQUE: Contiguous axial images were obtained from the base of the skull
through the vertex without intravenous contrast.

[Series 2: head wo · axial · 0.44mm/px · z∈[-90,+45]mm · 10 of 33 slices shown, 13 images]
[im 3/33  brain]
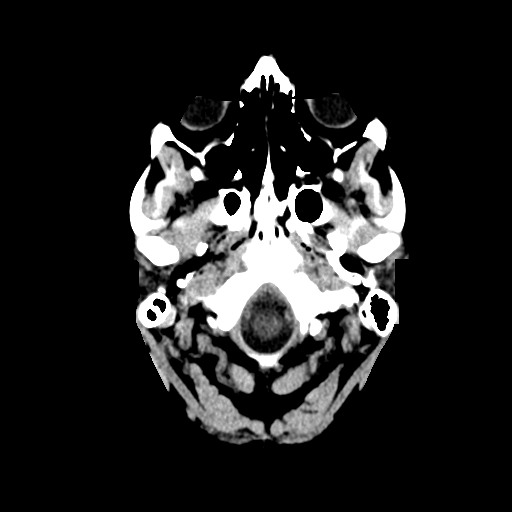
[im 3/33  bone]
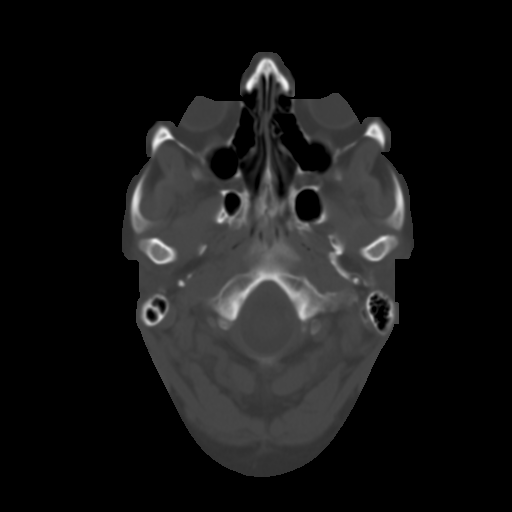
[im 6/33  brain]
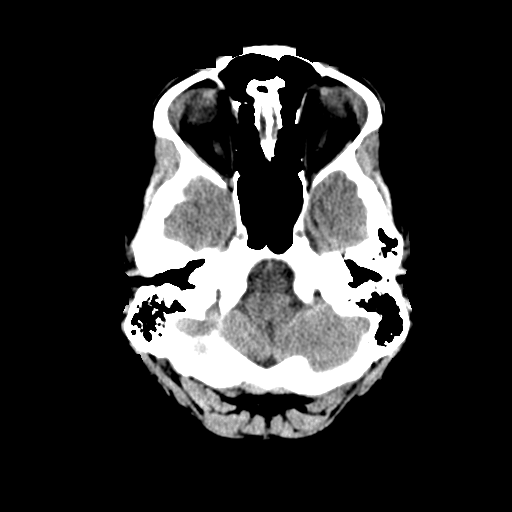
[im 9/33  brain]
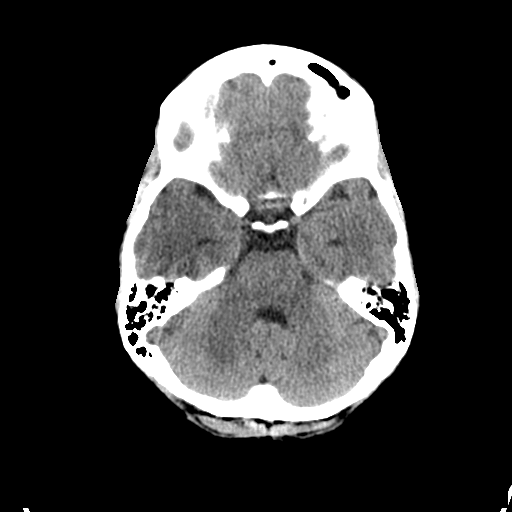
[im 12/33  brain]
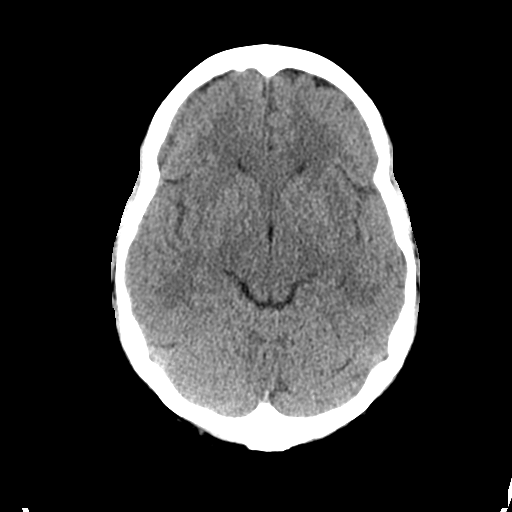
[im 15/33  brain]
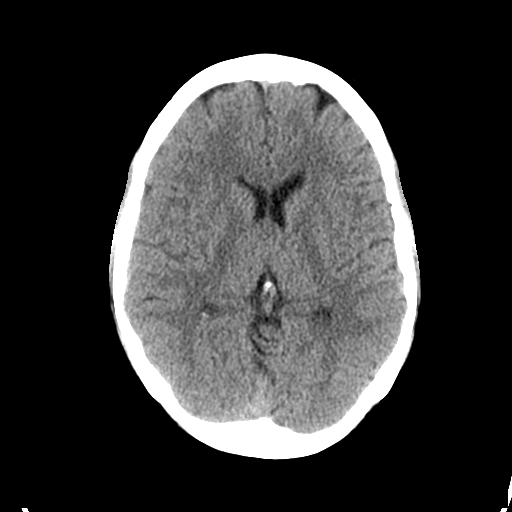
[im 15/33  bone]
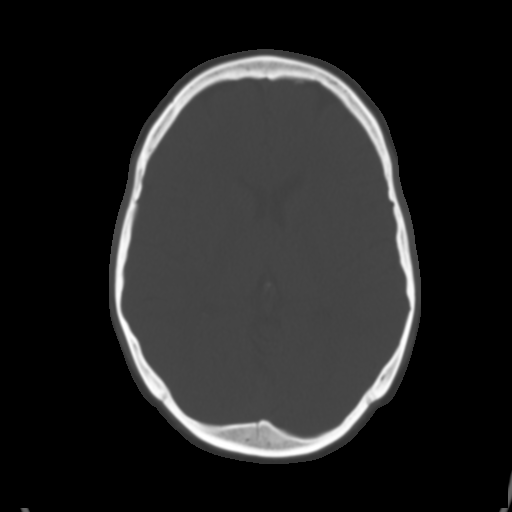
[im 18/33  brain]
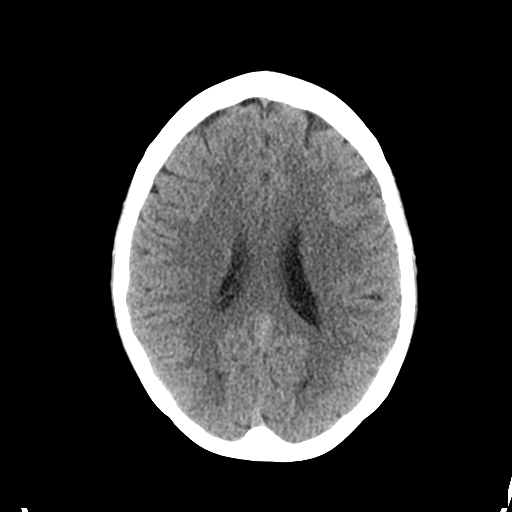
[im 21/33  brain]
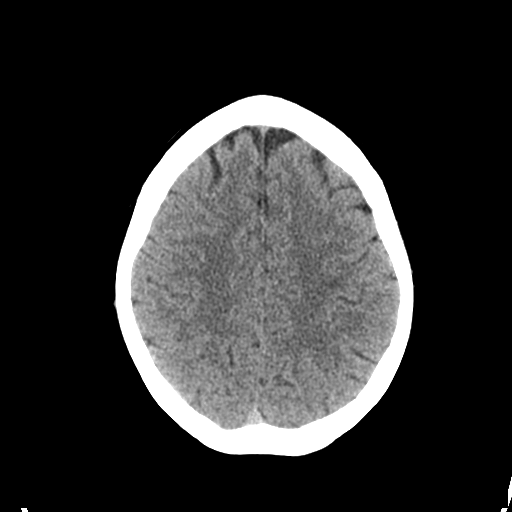
[im 25/33  brain]
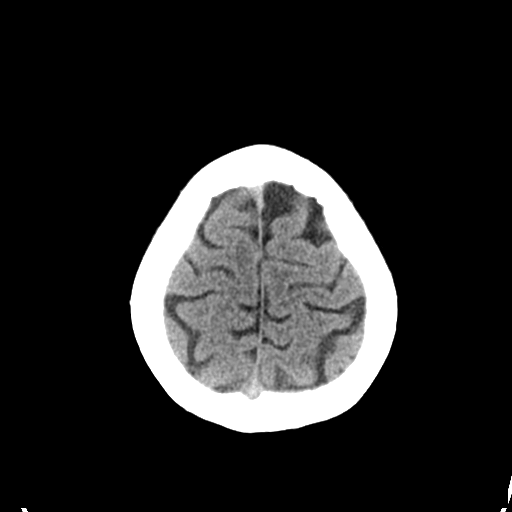
[im 27/33  brain]
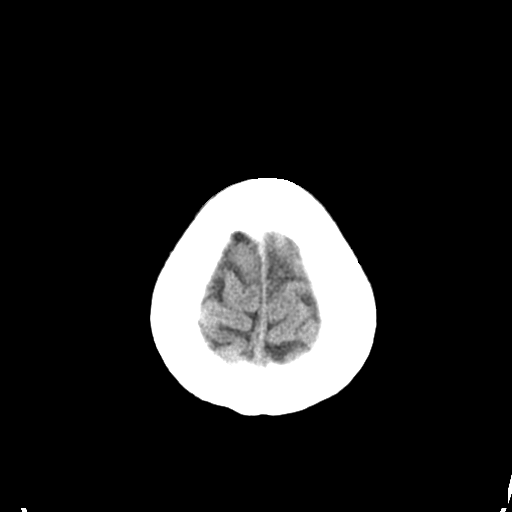
[im 27/33  bone]
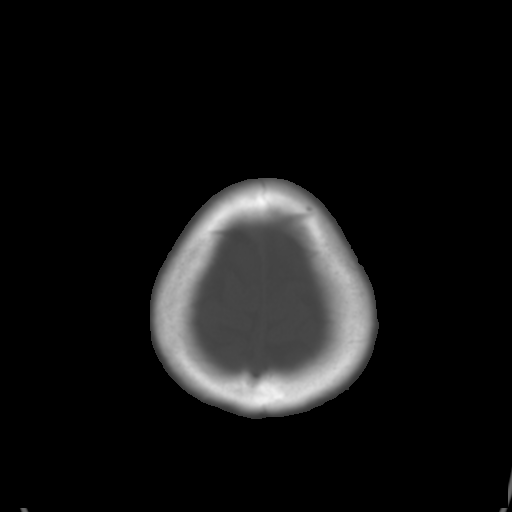
[im 30/33  brain]
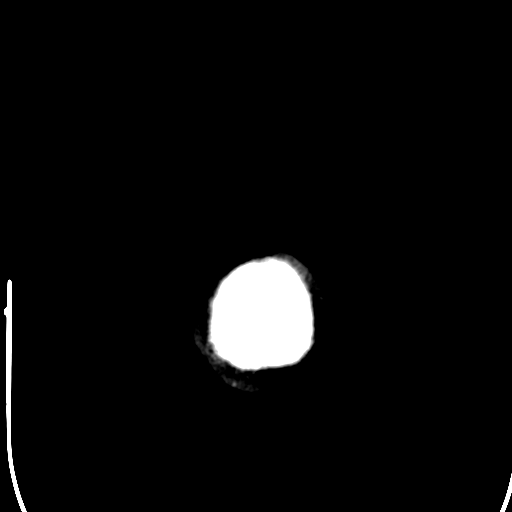

[Series 4: coronal soft tissue · coronal · 0.32mm/px · 3 of 71 slices shown]
[im 24/71  brain]
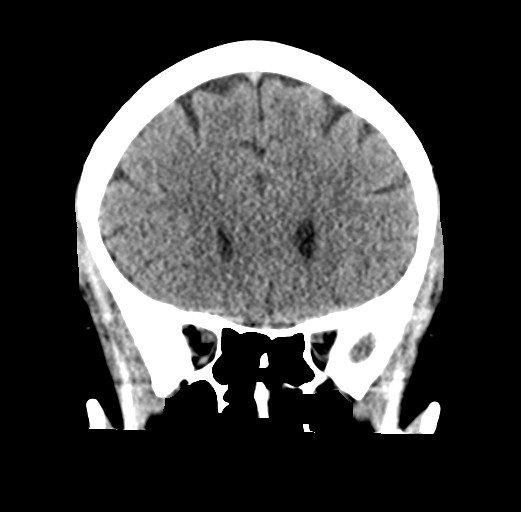
[im 32/71  brain]
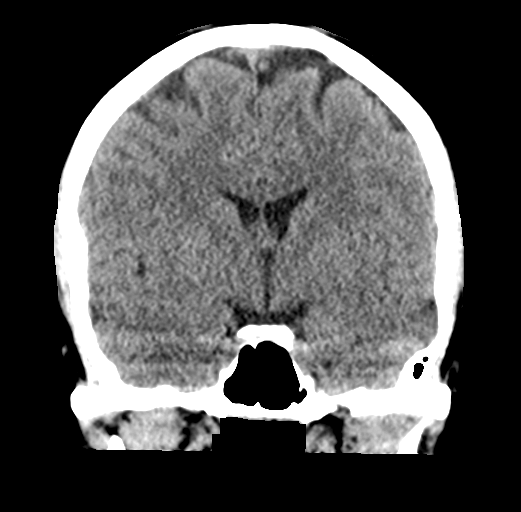
[im 39/71  brain]
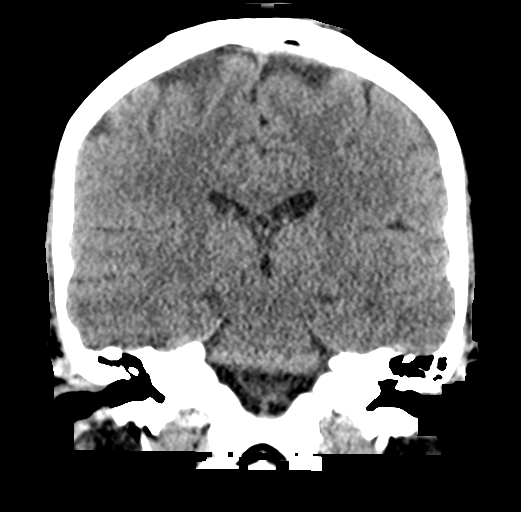

[Series 5: sagittal soft tissue · sagittal · 0.32mm/px · 3 of 55 slices shown]
[im 19/55  brain]
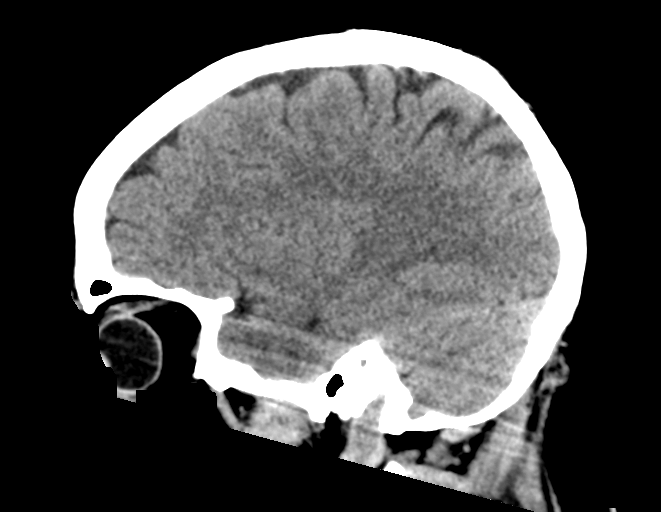
[im 28/55  brain]
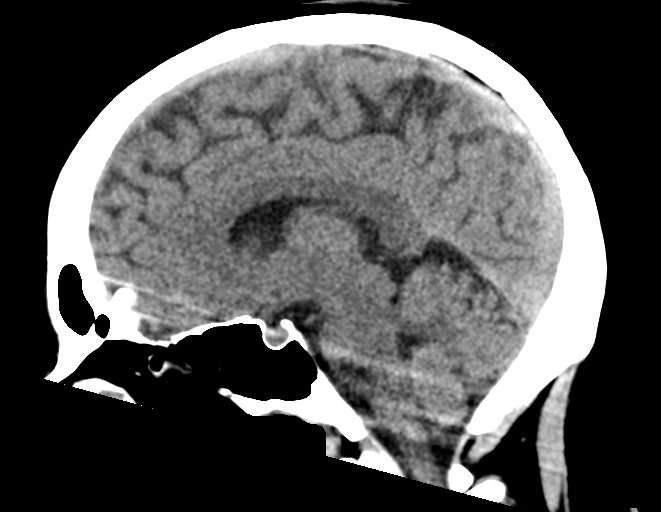
[im 37/55  brain]
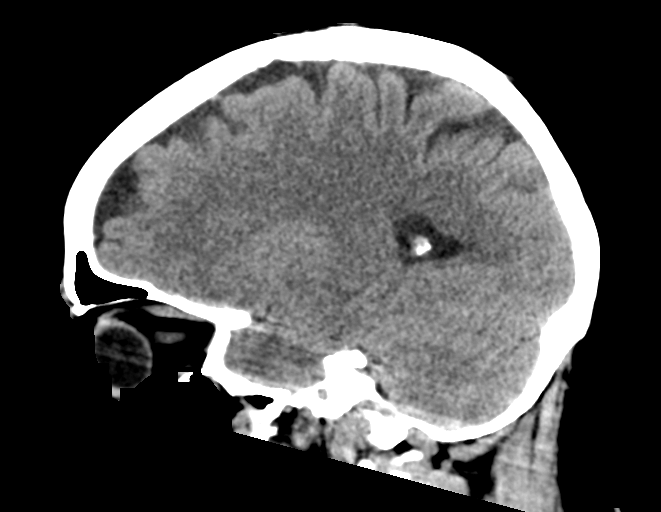

[16 of 47 positions shown; findings below may reference images not displayed]

FINDINGS: Brain:

No evidence of large-territorial acute infarction. No parenchymal
hemorrhage. No mass lesion. No extra-axial collection.

No mass effect or midline shift. No hydrocephalus. Basilar cisterns
are patent.

Vascular: No hyperdense vessel.

Skull: No acute fracture or focal lesion.

Sinuses/Orbits: Paranasal sinuses and mastoid air cells are clear.
The orbits are unremarkable.

Other: None.
IMPRESSION: No acute intracranial abnormality.

## 2022-07-14 ENCOUNTER — Ambulatory Visit (INDEPENDENT_AMBULATORY_CARE_PROVIDER_SITE_OTHER): Payer: 59 | Admitting: Podiatry

## 2022-07-14 ENCOUNTER — Encounter: Payer: Self-pay | Admitting: Podiatry

## 2022-07-14 DIAGNOSIS — L603 Nail dystrophy: Secondary | ICD-10-CM | POA: Diagnosis not present

## 2022-07-14 NOTE — Progress Notes (Signed)
  Subjective:  Patient ID: Janet Summers, female    DOB: 04/07/1980,  MRN: [id number] HPI Chief Complaint  Patient presents with   Nail Problem    Toenails bilateral - thickened, white discoloration on a few nails, feet itch especially the left, concerned about varicose veins-sensitive over them   New Patient (Initial Visit)    42 y.o. female presents with the above complaint.   ROS: Denies fever chills nausea vomit muscle aches pains calf pain back pain chest pain shortness of breath.  Past Medical History:  Diagnosis Date   Anxiety    Constipation    Depression    Dyspareunia    Seizures    Past Surgical History:  Procedure Laterality Date   COLONOSCOPY N/A 12/22/2014   Procedure: COLONOSCOPY;  Surgeon: Wallace Cullens, MD;  Location: [facility] ENDOSCOPY;  Service: Gastroenterology;  Laterality: N/A;   ESOPHAGOGASTRODUODENOSCOPY (EGD) WITH PROPOFOL N/A 12/22/2014   Procedure: ESOPHAGOGASTRODUODENOSCOPY (EGD) WITH PROPOFOL;  Surgeon: Wallace Cullens, MD;  Location[facility] ENDOSCOPY;  Service: Gastroenterology;  Laterality: N/A;   LEEP      Current Outpatient Medications:    ergocalciferol (VITAMIN D2) 1.25 MG (50000 UT) capsule, 1 capsule Orally weekly for 90 days, Disp: , Rfl:    lamoTRIgine (LAMICTAL) 25 MG tablet, Take by mouth., Disp: , Rfl:    LOMAIRA 8 MG TABS, 1 tablet 30 minutes before meal Orally twice a day for 60 days, Disp: , Rfl:    levonorgestrel (MIRENA) 20 MCG/24HR IUD, 1 each by Intrauterine route once., Disp: , Rfl:   No Known Allergies Review of Systems Objective:  There were no vitals filed for this visit.  General: Well developed, nourished, in no acute distress, alert and oriented x3   Dermatological: Skin is warm, dry and supple bilateral. Nails x 10 are well maintained; remaining integument appears unremarkable at this time. There are no open sores, no preulcerative lesions, she does have tinea type rash to the plantar aspect of the bilateral foot with multiple dried  vesicular lesions.  Toenails particularly the hallux and second digits do demonstrate some nail dystrophy possible onychomycotic infection.   Vascular: Dorsalis Pedis artery and Posterior Tibial artery pedal pulses are 2/4 bilateral with immedate capillary fill time. Pedal hair growth present. No varicosities and no lower extremity edema present bilateral.   Neruologic: Grossly intact via light touch bilateral. Vibratory intact via tuning fork bilateral. Protective threshold with Semmes Wienstein monofilament intact to all pedal sites bilateral. Patellar and Achilles deep tendon reflexes 2+ bilateral. No Babinski or clonus noted bilateral.   Musculoskeletal: No gross boney pedal deformities bilateral. No pain, crepitus, or limitation noted with foot and ankle range of motion bilateral. Muscular strength 5/5 in all groups tested bilateral.  Gait: Unassisted, Nonantalgic.    Radiographs:  None taken  Assessment & Plan:   Assessment: Nail dystrophy and tinea pedis  Multiple varicosities  Plan: Samples of the skin and nail were taken today for pathologic evaluation.  Encouraged her to follow-up with a vein group if necessary if she needs [place] to refer she will notify us.     Raiden Yearwood T. [place], [place]

## 2022-08-03 DIAGNOSIS — N809 Endometriosis, unspecified: Secondary | ICD-10-CM | POA: Insufficient documentation

## 2022-08-11 ENCOUNTER — Ambulatory Visit (INDEPENDENT_AMBULATORY_CARE_PROVIDER_SITE_OTHER): Payer: 59 | Admitting: Podiatry

## 2022-08-11 ENCOUNTER — Encounter: Payer: Self-pay | Admitting: Podiatry

## 2022-08-11 DIAGNOSIS — L603 Nail dystrophy: Secondary | ICD-10-CM

## 2022-08-11 MED ORDER — TERBINAFINE HCL 250 MG PO TABS: 250.0000 mg | ORAL_TABLET | Freq: Every day | ORAL | 0 refills | Status: DC

## 2022-08-11 NOTE — Progress Notes (Signed)
She presents today for follow-up of her nail fungus.  She states that is really the same has not changed.  Objective: Also stable oriented x 3 no change in physical exam.  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  Plan: Discussed etiology pathology and surgical therapies discussed topical therapy laser therapy and oral therapy.  She had complete metabolic panel done back in February which was perfectly normal.  So we will go and start her on the Lamisil therapy.  We dispensed a prescription for Lamisil today 250 mg tablets.  She will take 1 tablet daily for the next 30 days.  I will follow-up with her in 1 month for blood work.

## 2022-08-16 MED ORDER — TERBINAFINE HCL 250 MG PO TABS: 250.0000 mg | ORAL_TABLET | Freq: Every day | ORAL | 0 refills | Status: DC

## 2022-09-08 ENCOUNTER — Encounter: Payer: Self-pay | Admitting: Podiatry

## 2022-09-08 ENCOUNTER — Ambulatory Visit (INDEPENDENT_AMBULATORY_CARE_PROVIDER_SITE_OTHER): Payer: 59 | Admitting: Podiatry

## 2022-09-08 DIAGNOSIS — L603 Nail dystrophy: Secondary | ICD-10-CM

## 2022-09-08 DIAGNOSIS — Z79899 Other long term (current) drug therapy: Secondary | ICD-10-CM

## 2022-09-08 MED ORDER — TERBINAFINE HCL 250 MG PO TABS: 250.0000 mg | ORAL_TABLET | Freq: Every day | ORAL | 0 refills | Status: DC

## 2022-09-09 LAB — COMPREHENSIVE METABOLIC PANEL
AG Ratio: 1.8 (calc) (ref 1.0–2.5)
ALT: 12 U/L (ref 6–29)
AST: 12 U/L (ref 10–30)
Albumin: 4.6 g/dL (ref 3.6–5.1)
Alkaline phosphatase (APISO): 81 U/L (ref 31–125)
BUN: 11 mg/dL (ref 7–25)
CO2: 25 mmol/L (ref 20–32)
Calcium: 10.1 mg/dL (ref 8.6–10.2)
Chloride: 104 mmol/L (ref 98–110)
Creat: 0.77 mg/dL (ref 0.50–0.99)
Globulin: 2.5 g/dL (calc) (ref 1.9–3.7)
Glucose, Bld: 103 mg/dL (ref 65–139)
Potassium: 4.8 mmol/L (ref 3.5–5.3)
Sodium: 139 mmol/L (ref 135–146)
Total Bilirubin: 0.3 mg/dL (ref 0.2–1.2)
Total Protein: 7.1 g/dL (ref 6.1–8.1)

## 2022-09-11 NOTE — Progress Notes (Signed)
She presents today for follow-up of her Lamisil therapy she is completed her first 30 days.  Denies fever chills nausea vomit muscle aches pains calf pain back pain chest pain shortness of breath itching or rashes.  She states that her rashes and her toenails are starting to look better.  Objective: No change in physical exam.  Assessment: Onychomycosis long-term therapy with Lamisil.  Plan: We are going to request blood work consisting of a comprehensive metabolic panel as well as refilling her Lamisil for the next 90 days.  1 tablet daily for the next 90 days and I will follow-up with her in 4 months.  Should her blood work come back abnormal we will notify her immediately and change her directions on her medication.

## 2022-09-12 ENCOUNTER — Inpatient Hospital Stay: Admission: RE | Admit: 2022-09-12 | Payer: 59 | Source: Ambulatory Visit

## 2022-09-14 ENCOUNTER — Other Ambulatory Visit: Payer: Self-pay | Admitting: Obstetrics and Gynecology

## 2022-09-15 ENCOUNTER — Telehealth: Payer: Self-pay | Admitting: *Deleted

## 2022-09-15 NOTE — Telephone Encounter (Signed)
-----   Message from Elinor Parkinson, [place] sent at 09/14/2022  7:08 AM EDT ----- Blood work is normal

## 2022-09-16 ENCOUNTER — Other Ambulatory Visit: Payer: Self-pay

## 2022-09-16 ENCOUNTER — Encounter
Admission: RE | Admit: 2022-09-16 | Discharge: 2022-09-16 | Disposition: A | Discharge status: Home / Self Care | Payer: 59 | Source: Ambulatory Visit | Attending: Obstetrics and Gynecology | Admitting: Obstetrics and Gynecology

## 2022-09-16 DIAGNOSIS — Z01812 Encounter for preprocedural laboratory examination: Secondary | ICD-10-CM

## 2022-09-16 HISTORY — DX: Family history of other specified conditions: Z84.89

## 2022-09-16 HISTORY — DX: Tinea unguium: B35.1

## 2022-09-16 HISTORY — DX: Gastro-esophageal reflux disease without esophagitis: K21.9

## 2022-09-16 HISTORY — DX: Headache, unspecified: R51.9

## 2022-09-16 HISTORY — DX: Anemia, unspecified: D64.9

## 2022-09-16 HISTORY — DX: Pneumonia, unspecified organism: J18.9

## 2022-09-16 NOTE — H&P (Signed)
Janet Summers is a 42 y.o. female presenting with Pre Op Consulting   History of Present Illness: Patient returns today for preop visit. She has lost IUD strings of an intrauterine device pt has opted for hysterectomy for hx of chronic pelvic pain and suspicion of endometriosis. I have recommended PFPT following surgery.     However, her insurance has decided that the surgical plan we came up with is "Not medically necessary" and has sent her a letter saying that, including "IUD removal not medically necessary".    She had Mirena placed in 08/2014    Last pap 11/2021 neg/neg   TVUS 07/08/22 at [facility]    Pertinent hx:  -Hx of chronic pelvic pain and pelvic pain after intercourse -Hx of LEEP with CIN2 and indeterminate margins in 2016 - IUD placed then 08/2014 -IUD for pelvic pain, considered pelvic pain if no improvement vs surgery - dx lap   -Anxiety, started lexapro 2019 -Hx of OCPs for her pain -Hx of Dyspareunia in 2016 -Hx of seizures in 2013 -Pt had cervical biopsy w/ loop and colonoscopy in 2016   Past Medical History:  has a past medical history of Abnormal cytology, Anxiety, Constipation due to slow transit, Depression, Dyspareunia (07/09/2014), and Seizures (CMS/HHS-HCC) (2013).  Past Surgical History:  has a past surgical history that includes Cervical biopsy w/ loop electrode excision and Colonoscopy (12/22/2014). Family History: family history includes Alcohol abuse in her father; Anxiety in her father and sister; Bipolar disorder in her father, mother, and sister; Breast cancer in her paternal aunt, paternal aunt, paternal aunt, and paternal grandmother; Colon cancer in her father; Depression in her brother, father, and sister; Diabetes type II in her mother; Heart disease in her paternal grandfather; High blood pressure (Hypertension) in her father; Hyperlipidemia (Elevated cholesterol) in her father; No Known Problems in her daughter and son; Obesity in her mother and  sister; Ovarian cancer in her maternal aunt; Thyroid disease in her maternal aunt, maternal aunt, mother, and sister. Social History:  reports that she has quit smoking. She has never used smokeless tobacco. She reports current alcohol use. She reports that she does not use drugs. OB/GYN History:  OB History       Gravida  2   Para  2   Term  2   Preterm      AB      Living  2        SAB      IAB      Ectopic      Molar      Multiple      Live Births              Allergies: has No Known Allergies. Medications: Current Medications  Current Outpatient Medications:    ergocalciferol, vitamin D2, 1,250 mcg (50,000 unit) capsule, Take 50,000 Units by mouth once a week, Disp: , Rfl:    gabapentin (NEURONTIN) 100 MG capsule, 1 po qHS, Disp: 30 capsule, Rfl: 1   lamoTRIgine (LAMICTAL) 25 MG tablet, TAKE 2 TABLETS(50 MG) BY MOUTH TWICE DAILY, Disp: 360 tablet, Rfl: 1   levonorgestrel (MIRENA) 20 mcg/24 hr (5 years) IUD, Insert 1 each into the uterus once. Follow package directions., Disp: , Rfl:    LOMAIRA 8 mg Tab, 1 tablet 30 minutes before meal Orally twice a day for 60 days, Disp: , Rfl:    nystatin (MYCOSTATIN) 100,000 unit/gram ointment, APPLY TOPICALLY TO THE AFFECTED AREA TWICE DAILY, Disp: 60 g,  Rfl: 0   solifenacin (VESICARE) 5 MG tablet, Take 1 tablet (5 mg total) by mouth once daily, Disp: 30 tablet, Rfl: 11   terbinafine HCL (LAMISIL) 250 mg tablet, Take 250 mcg by mouth every morning before breakfast (0630), Disp: , Rfl:      Review of Systems: No SOB, no palpitations or chest pain, no new lower extremity edema, no nausea or vomiting or bowel or bladder complaints. See HPI for gyn specific ROS.    Exam:    BP 116/79   Pulse 102   Ht 162.6 cm (5\' 4" )   Wt 87.5 kg (192 lb 12.8 oz)   BMI 33.09 kg/m    General: Patient is well-groomed, well-nourished, appears stated age in no acute distress   HEENT: head is atraumatic and normocephalic, trachea is  midline, neck is supple with no palpable nodules   CV: Regular rhythm and normal heart rate, no murmur   Pulm: Clear to auscultation throughout lung fields with no wheezing, crackles, or rhonchi. No increased work of breathing   Abdomen: soft , no mass, non-tender, no rebound tenderness, no hepatomegaly     Impression:    The primary encounter diagnosis was Intrauterine contraceptive device threads lost, subsequent encounter. Diagnoses of Chronic pelvic pain in female and Endometriosis were also pertinent to this visit.   Plan:      -Patient returns for a preoperative discussion regarding her plans to proceed with surgical treatment of her lost IUD strings and pelvic pain by total laparoscopic hysterectomy with bilateral salpingectomy procedure.  We may perform a cystoscopy to evaluate the urinary tract after the procedure, if surgically indicated for uro tract integrity.    The patient and I discussed the technical aspects of the procedure including the potential for risks and complications.  These include but are not limited to the risk of infection requiring post-operative antibiotics or further procedures.  We talked about the risk of injury to adjacent organs including bladder, bowel, ureter, blood vessels or nerves.  We talked about the need to convert to an open incision.  We talked about the possible need for blood transfusion.  We talked about postop complications such as thromboembolic or cardiopulmonary complications.  All of her questions were answered.   Due to insurance, consents not signed today. Will return to this later.    Diagnoses and all orders for this visit:   Intrauterine contraceptive device threads lost, subsequent encounter   Chronic pelvic pain in female   Endometriosis

## 2022-09-16 NOTE — Patient Instructions (Addendum)
Your procedure is scheduled on: 09/23/22 - Friday Report to the Registration Desk on the 1st floor of the [facility]. To find out your arrival time, please call [phone] between 1PM - 3PM on: 09/22/22 - Thursday If your arrival time is 6:00 am, do not arrive before that time as the [facility] entrance doors do not open until 6:00 am.  REMEMBER: Instructions that are not followed completely may result in serious medical risk, up to and including death; or upon the discretion of your surgeon and anesthesiologist your surgery may need to be rescheduled.  Do not eat food after midnight the night before surgery.  No gum chewing or hard candies.  You may however, drink CLEAR liquids up to 2 hours before you are scheduled to arrive for your surgery. Do not drink anything within 2 hours of your scheduled arrival time.  Clear liquids include: - water  - apple juice without pulp - gatorade (not RED colors) - black coffee or tea (Do NOT add milk or creamers to the coffee or tea) Do NOT drink anything that is not on this list.   One week prior to surgery: Stop Anti-inflammatories (NSAIDS) such as Advil, Aleve, Ibuprofen, Motrin, Naproxen, Naprosyn and Aspirin based products such as Excedrin, Goody's Powder, BC Powder.  Stop ANY OVER THE COUNTER supplements until after surgery.  You may take Tylenol if needed for pain up until the day of surgery.    TAKE ONLY THESE MEDICATIONS THE MORNING OF SURGERY WITH A SIP OF WATER:  lamoTRIgine (LAMICTAL)  solifenacin (VESICARE))  terbinafine (LAMISIL)    No Alcohol for 24 hours before or after surgery.  No Smoking including e-cigarettes for 24 hours before surgery.  No chewable tobacco products for at least 6 hours before surgery.  No nicotine patches on the day of surgery.  Do not use any "recreational" drugs for at least a week (preferably 2 weeks) before your surgery.  Please be advised that the combination of cocaine and anesthesia  may have negative outcomes, up to and including death. If you test positive for cocaine, your surgery will be cancelled.  On the morning of surgery brush your teeth with toothpaste and water, you may rinse your mouth with mouthwash if you wish. Do not swallow any toothpaste or mouthwash.  Use CHG Soap or wipes as directed on instruction sheet.  Do not wear jewelry, make-up, hairpins, clips or nail polish.  Do not wear lotions, powders, or perfumes.   Do not shave body hair from the neck down 48 hours before surgery.  Contact lenses, hearing aids and dentures may not be worn into surgery.  Do not bring valuables to the hospital. [facility] is not responsible for any missing/lost belongings or valuables.   Notify your doctor if there is any change in your medical condition (cold, fever, infection).  Wear comfortable clothing (specific to your surgery type) to the hospital.  After surgery, you can help prevent lung complications by doing breathing exercises.  Take deep breaths and cough every 1-2 hours. Your doctor may order a device called an Incentive Spirometer to help you take deep breaths. When coughing or sneezing, hold a pillow firmly against your incision with both hands. This is called "splinting." Doing this helps protect your incision. It also decreases belly discomfort.  If you are being admitted to the hospital overnight, leave your suitcase in the car. After surgery it may be brought to your room.  In case of increased patient census, it  may be necessary for you, the patient, to continue your postoperative care in the Same Day Surgery department.  If you are being discharged the day of surgery, you will not be allowed to drive home. You will need a responsible individual to drive you home and stay with you for 24 hours after surgery.   If you are taking public transportation, you will need to have a responsible individual with you.  Please call the Pre-admissions Testing  Dept. at [phone] if you have any questions about these instructions.  Surgery Visitation Policy:  Patients having surgery or a procedure may have two visitors.  Children under the age of 60 must have an adult with them who is not the patient.  Inpatient Visitation:    Visiting hours are 7 a.m. to 8 p.m. Up to four visitors are allowed at one time in a patient room. The visitors may rotate out with other people during the day.  One visitor age 52 or older may stay with the patient overnight and must be in the room by 8 p.m.    Preparing for Surgery with CHLORHEXIDINE GLUCONATE (CHG) Soap  Chlorhexidine Gluconate (CHG) Soap  o An antiseptic cleaner that kills germs and bonds with the skin to continue killing germs even after washing  o Used for showering the night before surgery and morning of surgery  Before surgery, you can play an important role by reducing the number of germs on your skin.  CHG (Chlorhexidine gluconate) soap is an antiseptic cleanser which kills germs and bonds with the skin to continue killing germs even after washing.  Please do not use if you have an allergy to CHG or antibacterial soaps. If your skin becomes reddened/irritated stop using the CHG.  1. Shower the NIGHT BEFORE SURGERY and the MORNING OF SURGERY with CHG soap.  2. If you choose to wash your hair, wash your hair first as usual with your normal shampoo.  3. After shampooing, rinse your hair and body thoroughly to remove the shampoo.  4. Use CHG as you would any other liquid soap. You can apply CHG directly to the skin and wash gently with a scrungie or a clean washcloth.  5. Apply the CHG soap to your body only from the neck down. Do not use on open wounds or open sores. Avoid contact with your eyes, ears, mouth, and genitals (private parts). Wash face and genitals (private parts) with your normal soap.  6. Wash thoroughly, paying special attention to the area where your surgery will be  performed.  7. Thoroughly rinse your body with warm water.  8. Do not shower/wash with your normal soap after using and rinsing off the CHG soap.  9. Pat yourself dry with a clean towel.  10. Wear clean pajamas to bed the night before surgery.  12. Place clean sheets on your bed the night of your first shower and do not sleep with pets.  13. Shower again with the CHG soap on the day of surgery prior to arriving at the hospital.  14. Do not apply any deodorants/lotions/powders.  15. Please wear clean clothes to the hospital. How to Use an Incentive Spirometer  An incentive spirometer is a tool that measures how well you are filling your lungs with each breath. Learning to take long, deep breaths using this tool can help you keep your lungs clear and active. This may help to reverse or lessen your chance of developing breathing (pulmonary) problems, especially infection. You may  be asked to use a spirometer: After a surgery. If you have a lung problem or a history of smoking. After a long period of time when you have been unable to move or be active. If the spirometer includes an indicator to show the highest number that you have reached, your health care provider or respiratory therapist will help you set a goal. Keep a log of your progress as told by your health care provider. What are the risks? Breathing too quickly may cause dizziness or cause you to pass out. Take your time so you do not get dizzy or light-headed. If you are in pain, you may need to take pain medicine before doing incentive spirometry. It is harder to take a deep breath if you are having pain. How to use your incentive spirometer  Sit up on the edge of your bed or on a chair. Hold the incentive spirometer so that it is in an upright position. Before you use the spirometer, breathe out normally. Place the mouthpiece in your mouth. Make sure your lips are closed tightly around it. Breathe in slowly and as deeply as  you can through your mouth, causing the piston or the ball to rise toward the top of the chamber. Hold your breath for 3-5 seconds, or for as long as possible. If the spirometer includes a coach indicator, use this to guide you in breathing. Slow down your breathing if the indicator goes above the marked areas. Remove the mouthpiece from your mouth and breathe out normally. The piston or ball will return to the bottom of the chamber. Rest for a few seconds, then repeat the steps 10 or more times. Take your time and take a few normal breaths between deep breaths so that you do not get dizzy or light-headed. Do this every 1-2 hours when you are awake. If the spirometer includes a goal marker to show the highest number you have reached (best effort), use this as a goal to work toward during each repetition. After each set of 10 deep breaths, cough a few times. This will help to make sure that your lungs are clear. If you have an incision on your chest or abdomen from surgery, place a pillow or a rolled-up towel firmly against the incision when you cough. This can help to reduce pain while taking deep breaths and coughing. General tips When you are able to get out of bed: Walk around often. Continue to take deep breaths and cough in order to clear your lungs. Keep using the incentive spirometer until your health care provider says it is okay to stop using it. If you have been in the hospital, you may be told to keep using the spirometer at home. Contact a health care provider if: You are having difficulty using the spirometer. You have trouble using the spirometer as often as instructed. Your pain medicine is not giving enough relief for you to use the spirometer as told. You have a fever. Get help right away if: You develop shortness of breath. You develop a cough with bloody mucus from the lungs. You have fluid or blood coming from an incision site after you cough. Summary An incentive  spirometer is a tool that can help you learn to take long, deep breaths to keep your lungs clear and active. You may be asked to use a spirometer after a surgery, if you have a lung problem or a history of smoking, or if you have been inactive for a long  period of time. Use your incentive spirometer as instructed every 1-2 hours while you are awake. If you have an incision on your chest or abdomen, place a pillow or a rolled-up towel firmly against your incision when you cough. This will help to reduce pain. Get help right away if you have shortness of breath, you cough up bloody mucus, or blood comes from your incision when you cough. This information is not intended to replace advice given to you by your health care provider. Make sure you discuss any questions you have with your health care provider. Document Revised: 06/10/2019 Document Reviewed: 06/10/2019 Elsevier Patient Education  2023 [employer].

## 2022-09-19 ENCOUNTER — Encounter
Admission: RE | Admit: 2022-09-19 | Discharge: 2022-09-19 | Disposition: A | Discharge status: Home / Self Care | Payer: 59 | Source: Ambulatory Visit | Attending: Obstetrics and Gynecology | Admitting: Obstetrics and Gynecology

## 2022-09-19 DIAGNOSIS — Z01812 Encounter for preprocedural laboratory examination: Secondary | ICD-10-CM | POA: Insufficient documentation

## 2022-09-19 DIAGNOSIS — G8929 Other chronic pain: Secondary | ICD-10-CM | POA: Diagnosis not present

## 2022-09-19 DIAGNOSIS — R102 Pelvic and perineal pain: Secondary | ICD-10-CM | POA: Insufficient documentation

## 2022-09-19 LAB — TYPE AND SCREEN
ABO/RH(D): A POS
Antibody Screen: NEGATIVE

## 2022-09-19 LAB — CBC
HCT: 41.4 % (ref 36.0–46.0)
Hemoglobin: 13.3 g/dL (ref 12.0–15.0)
MCH: 27.7 pg (ref 26.0–34.0)
MCHC: 32.1 g/dL (ref 30.0–36.0)
MCV: 86.3 fL (ref 80.0–100.0)
Platelets: 411 10*3/uL — ABNORMAL HIGH (ref 150–400)
RBC: 4.8 MIL/uL (ref 3.87–5.11)
RDW: 12.4 % (ref 11.5–15.5)
WBC: 8 10*3/uL (ref 4.0–10.5)
nRBC: 0 % (ref 0.0–0.2)

## 2022-09-22 MED ORDER — CHLORHEXIDINE GLUCONATE 0.12 % MT SOLN
15.0000 mL | Freq: Once | OROMUCOSAL | Status: AC
  Administered 2022-09-23: 15 mL via OROMUCOSAL

## 2022-09-22 MED ORDER — ORAL CARE MOUTH RINSE: 15.0000 mL | Freq: Once | OROMUCOSAL | Status: AC

## 2022-09-22 MED ORDER — FAMOTIDINE 20 MG PO TABS
20.0000 mg | ORAL_TABLET | Freq: Once | ORAL | Status: AC
  Administered 2022-09-23: 20 mg via ORAL

## 2022-09-22 MED ORDER — CEFAZOLIN SODIUM-DEXTROSE 2-4 GM/100ML-% IV SOLN
2.0000 g | INTRAVENOUS | Status: AC
  Administered 2022-09-23: 2 g via INTRAVENOUS

## 2022-09-22 MED ORDER — LACTATED RINGERS IV SOLN: INTRAVENOUS | Status: DC

## 2022-09-22 MED ORDER — POVIDONE-IODINE 10 % EX SWAB
2.0000 | Freq: Once | CUTANEOUS | Status: AC
  Administered 2022-09-23: 2 via TOPICAL

## 2022-09-22 MED ORDER — GABAPENTIN 300 MG PO CAPS
300.0000 mg | ORAL_CAPSULE | ORAL | Status: AC
  Administered 2022-09-23: 300 mg via ORAL

## 2022-09-22 MED ORDER — ACETAMINOPHEN 500 MG PO TABS
1000.0000 mg | ORAL_TABLET | ORAL | Status: AC
  Administered 2022-09-23: 1000 mg via ORAL

## 2022-09-23 ENCOUNTER — Ambulatory Visit: Payer: 59 | Admitting: Anesthesiology

## 2022-09-23 ENCOUNTER — Encounter: Payer: Self-pay | Admitting: Obstetrics and Gynecology

## 2022-09-23 ENCOUNTER — Ambulatory Visit: Payer: 59 | Admitting: Urgent Care

## 2022-09-23 ENCOUNTER — Ambulatory Visit
Admission: RE | Admit: 2022-09-23 | Discharge: 2022-09-23 | Disposition: A | Discharge status: Home / Self Care | Payer: 59 | Source: Ambulatory Visit | Attending: Obstetrics and Gynecology | Admitting: Obstetrics and Gynecology

## 2022-09-23 ENCOUNTER — Other Ambulatory Visit: Payer: Self-pay

## 2022-09-23 ENCOUNTER — Encounter
Admission: RE | Disposition: A | Discharge status: Home / Self Care | Payer: Self-pay | Source: Ambulatory Visit | Attending: Obstetrics and Gynecology

## 2022-09-23 DIAGNOSIS — Z87891 Personal history of nicotine dependence: Secondary | ICD-10-CM | POA: Diagnosis not present

## 2022-09-23 DIAGNOSIS — R569 Unspecified convulsions: Secondary | ICD-10-CM | POA: Diagnosis not present

## 2022-09-23 DIAGNOSIS — F419 Anxiety disorder, unspecified: Secondary | ICD-10-CM | POA: Diagnosis not present

## 2022-09-23 DIAGNOSIS — D259 Leiomyoma of uterus, unspecified: Secondary | ICD-10-CM | POA: Insufficient documentation

## 2022-09-23 DIAGNOSIS — N879 Dysplasia of cervix uteri, unspecified: Secondary | ICD-10-CM | POA: Diagnosis not present

## 2022-09-23 DIAGNOSIS — N80103 Endometriosis of bilateral ovaries, unspecified depth: Secondary | ICD-10-CM | POA: Insufficient documentation

## 2022-09-23 DIAGNOSIS — R102 Pelvic and perineal pain: Secondary | ICD-10-CM | POA: Insufficient documentation

## 2022-09-23 DIAGNOSIS — G8929 Other chronic pain: Secondary | ICD-10-CM | POA: Insufficient documentation

## 2022-09-23 DIAGNOSIS — Z30432 Encounter for removal of intrauterine contraceptive device: Secondary | ICD-10-CM | POA: Diagnosis not present

## 2022-09-23 DIAGNOSIS — F32A Depression, unspecified: Secondary | ICD-10-CM | POA: Insufficient documentation

## 2022-09-23 DIAGNOSIS — N7091 Salpingitis, unspecified: Secondary | ICD-10-CM | POA: Insufficient documentation

## 2022-09-23 HISTORY — PX: LAPAROSCOPY: SHX197

## 2022-09-23 HISTORY — PX: LYSIS OF ADHESION: SHX5961

## 2022-09-23 HISTORY — PX: CYSTOSCOPY: SHX5120

## 2022-09-23 HISTORY — PX: ROBOTIC ASSISTED LAPAROSCOPIC HYSTERECTOMY AND SALPINGECTOMY: SHX6379

## 2022-09-23 HISTORY — PX: IUD REMOVAL: SHX5392

## 2022-09-23 LAB — POCT PREGNANCY, URINE: Preg Test, Ur: NEGATIVE

## 2022-09-23 LAB — ABO/RH: ABO/RH(D): A POS

## 2022-09-23 SURGERY — XI ROBOTIC ASSISTED LAPAROSCOPIC HYSTERECTOMY AND SALPINGECTOMY
Anesthesia: General | Site: Uterus

## 2022-09-23 MED ORDER — OXYCODONE HCL 5 MG PO TABS
5.0000 mg | ORAL_TABLET | Freq: Once | ORAL | Status: AC | PRN
  Administered 2022-09-23: 5 mg via ORAL

## 2022-09-23 MED ORDER — MIDAZOLAM HCL 2 MG/2ML IJ SOLN
INTRAMUSCULAR | Status: DC | PRN
  Administered 2022-09-23: 2 mg via INTRAVENOUS

## 2022-09-23 MED ORDER — FENTANYL CITRATE (PF) 100 MCG/2ML IJ SOLN
INTRAMUSCULAR | Status: DC | PRN
  Administered 2022-09-23 (×2): 50 ug via INTRAVENOUS

## 2022-09-23 MED ORDER — ONDANSETRON HCL 4 MG/2ML IJ SOLN
INTRAMUSCULAR | Status: DC | PRN
  Administered 2022-09-23: 4 mg via INTRAVENOUS

## 2022-09-23 MED ORDER — CEFAZOLIN SODIUM-DEXTROSE 2-4 GM/100ML-% IV SOLN
INTRAVENOUS | Status: AC
  Filled 2022-09-23: qty 100

## 2022-09-23 MED ORDER — BUPIVACAINE HCL (PF) 0.5 % IJ SOLN
INTRAMUSCULAR | Status: AC
  Filled 2022-09-23: qty 30

## 2022-09-23 MED ORDER — CHLORHEXIDINE GLUCONATE 0.12 % MT SOLN
OROMUCOSAL | Status: AC
  Filled 2022-09-23: qty 15

## 2022-09-23 MED ORDER — PROPOFOL 10 MG/ML IV BOLUS
INTRAVENOUS | Status: DC | PRN
  Administered 2022-09-23: 150 ug via INTRAVENOUS
  Administered 2022-09-23 (×2): 100 ug/kg/min via INTRAVENOUS

## 2022-09-23 MED ORDER — DEXAMETHASONE SODIUM PHOSPHATE 10 MG/ML IJ SOLN
INTRAMUSCULAR | Status: DC | PRN
  Administered 2022-09-23: 10 mg via INTRAVENOUS

## 2022-09-23 MED ORDER — BUPIVACAINE HCL (PF) 0.5 % IJ SOLN
INTRAMUSCULAR | Status: DC | PRN
  Administered 2022-09-23: 20 mL

## 2022-09-23 MED ORDER — OXYCODONE HCL 5 MG PO TABS
ORAL_TABLET | ORAL | Status: AC
  Filled 2022-09-23: qty 1

## 2022-09-23 MED ORDER — ROCURONIUM BROMIDE 10 MG/ML (PF) SYRINGE
PREFILLED_SYRINGE | INTRAVENOUS | Status: AC
  Filled 2022-09-23: qty 10

## 2022-09-23 MED ORDER — LIDOCAINE HCL (CARDIAC) PF 100 MG/5ML IV SOSY
PREFILLED_SYRINGE | INTRAVENOUS | Status: DC | PRN
  Administered 2022-09-23: 100 mg via INTRAVENOUS

## 2022-09-23 MED ORDER — SODIUM CHLORIDE 0.9 % IR SOLN
Status: DC | PRN
  Administered 2022-09-23: 500 mL

## 2022-09-23 MED ORDER — OXYCODONE HCL 5 MG PO TABS: 5.0000 mg | ORAL_TABLET | ORAL | 0 refills | Status: DC | PRN

## 2022-09-23 MED ORDER — LIDOCAINE HCL (PF) 2 % IJ SOLN
INTRAMUSCULAR | Status: AC
  Filled 2022-09-23: qty 5

## 2022-09-23 MED ORDER — KETOROLAC TROMETHAMINE 30 MG/ML IJ SOLN
INTRAMUSCULAR | Status: DC | PRN
  Administered 2022-09-23: 30 mg via INTRAVENOUS

## 2022-09-23 MED ORDER — SEVOFLURANE IN SOLN
RESPIRATORY_TRACT | Status: AC
  Filled 2022-09-23: qty 250

## 2022-09-23 MED ORDER — DOCUSATE SODIUM 100 MG PO CAPS: 100.0000 mg | ORAL_CAPSULE | Freq: Two times a day (BID) | ORAL | 0 refills | Status: AC

## 2022-09-23 MED ORDER — IBUPROFEN 800 MG PO TABS: 800.0000 mg | ORAL_TABLET | Freq: Three times a day (TID) | ORAL | 1 refills | Status: AC

## 2022-09-23 MED ORDER — SUCCINYLCHOLINE CHLORIDE 200 MG/10ML IV SOSY
PREFILLED_SYRINGE | INTRAVENOUS | Status: AC
  Filled 2022-09-23: qty 10

## 2022-09-23 MED ORDER — PHENYLEPHRINE 80 MCG/ML (10ML) SYRINGE FOR IV PUSH (FOR BLOOD PRESSURE SUPPORT)
PREFILLED_SYRINGE | INTRAVENOUS | Status: AC
  Filled 2022-09-23: qty 10

## 2022-09-23 MED ORDER — ONDANSETRON HCL 4 MG/2ML IJ SOLN
INTRAMUSCULAR | Status: AC
  Filled 2022-09-23: qty 2

## 2022-09-23 MED ORDER — ACETAMINOPHEN 500 MG PO TABS
ORAL_TABLET | ORAL | Status: AC
  Filled 2022-09-23: qty 2

## 2022-09-23 MED ORDER — SODIUM CHLORIDE 0.9 % IR SOLN
Status: DC | PRN
  Administered 2022-09-23: 750 mL

## 2022-09-23 MED ORDER — PROPOFOL 1000 MG/100ML IV EMUL
INTRAVENOUS | Status: AC
  Filled 2022-09-23: qty 100

## 2022-09-23 MED ORDER — OXYCODONE HCL 5 MG/5ML PO SOLN: 5.0000 mg | Freq: Once | ORAL | Status: AC | PRN

## 2022-09-23 MED ORDER — EPHEDRINE 5 MG/ML INJ
INTRAVENOUS | Status: AC
  Filled 2022-09-23: qty 5

## 2022-09-23 MED ORDER — 0.9 % SODIUM CHLORIDE (POUR BTL) OPTIME
TOPICAL | Status: DC | PRN
  Administered 2022-09-23: 500 mL

## 2022-09-23 MED ORDER — SUGAMMADEX SODIUM 200 MG/2ML IV SOLN
INTRAVENOUS | Status: DC | PRN
  Administered 2022-09-23: 200 mg via INTRAVENOUS

## 2022-09-23 MED ORDER — FAMOTIDINE 20 MG PO TABS
ORAL_TABLET | ORAL | Status: AC
  Filled 2022-09-23: qty 1

## 2022-09-23 MED ORDER — GABAPENTIN 800 MG PO TABS: 800.0000 mg | ORAL_TABLET | Freq: Every day | ORAL | 0 refills | Status: AC

## 2022-09-23 MED ORDER — HYDROMORPHONE HCL 1 MG/ML IJ SOLN
INTRAMUSCULAR | Status: AC
  Filled 2022-09-23: qty 1

## 2022-09-23 MED ORDER — DEXAMETHASONE SODIUM PHOSPHATE 10 MG/ML IJ SOLN
INTRAMUSCULAR | Status: AC
  Filled 2022-09-23: qty 1

## 2022-09-23 MED ORDER — MIDAZOLAM HCL 2 MG/2ML IJ SOLN
INTRAMUSCULAR | Status: AC
  Filled 2022-09-23: qty 2

## 2022-09-23 MED ORDER — FENTANYL CITRATE (PF) 100 MCG/2ML IJ SOLN
INTRAMUSCULAR | Status: AC
  Filled 2022-09-23: qty 2

## 2022-09-23 MED ORDER — EPHEDRINE SULFATE (PRESSORS) 50 MG/ML IJ SOLN
INTRAMUSCULAR | Status: DC | PRN
  Administered 2022-09-23: 5 mg via INTRAVENOUS

## 2022-09-23 MED ORDER — HYDROMORPHONE HCL 1 MG/ML IJ SOLN
0.2500 mg | INTRAMUSCULAR | Status: DC | PRN
  Administered 2022-09-23 (×2): 0.5 mg via INTRAVENOUS

## 2022-09-23 MED ORDER — PHENYLEPHRINE HCL (PRESSORS) 10 MG/ML IV SOLN
INTRAVENOUS | Status: DC | PRN
  Administered 2022-09-23 (×2): 80 ug via INTRAVENOUS

## 2022-09-23 MED ORDER — ACETAMINOPHEN EXTRA STRENGTH 500 MG PO TABS: 1000.0000 mg | ORAL_TABLET | Freq: Four times a day (QID) | ORAL | 0 refills | Status: AC

## 2022-09-23 MED ORDER — ROCURONIUM BROMIDE 100 MG/10ML IV SOLN
INTRAVENOUS | Status: DC | PRN
  Administered 2022-09-23: 50 mg via INTRAVENOUS
  Administered 2022-09-23: 30 mg via INTRAVENOUS
  Administered 2022-09-23: 50 mg via INTRAVENOUS

## 2022-09-23 MED ORDER — GABAPENTIN 300 MG PO CAPS
ORAL_CAPSULE | ORAL | Status: AC
  Filled 2022-09-23: qty 1

## 2022-09-23 SURGICAL SUPPLY — 92 items
ADH SKN CLS APL DERMABOND .7 (GAUZE/BANDAGES/DRESSINGS) ×4
APL SRG 38 LTWT LNG FL B (MISCELLANEOUS) ×4
APPLICATOR ARISTA FLEXITIP XL (MISCELLANEOUS) ×4 IMPLANT
BAG DRN RND TRDRP ANRFLXCHMBR (UROLOGICAL SUPPLIES) ×4
BAG URINE DRAIN 2000ML AR STRL (UROLOGICAL SUPPLIES) ×4 IMPLANT
BLADE SURG SZ11 CARB STEEL (BLADE) ×4 IMPLANT
CANNULA CAP OBTURATR AIRSEAL 8 (CAP) ×4 IMPLANT
CATH FOLEY 2WAY 5CC 16FR (CATHETERS) ×4
CATH ROBINSON RED A/P 16FR (CATHETERS) ×4 IMPLANT
CATH URTH 16FR FL 2W BLN LF (CATHETERS) ×4 IMPLANT
CORD MONOPOLAR M/FML 12FT (MISCELLANEOUS) ×4 IMPLANT
COUNTER NEEDLE 20/40 LG (NEEDLE) ×4 IMPLANT
COVER TIP SHEARS 8 DVNC (MISCELLANEOUS) ×4 IMPLANT
DERMABOND ADVANCED .7 DNX12 (GAUZE/BANDAGES/DRESSINGS) ×4 IMPLANT
DRAPE 3/4 80X56 (DRAPES) ×4 IMPLANT
DRAPE ARM DVNC X/XI (DISPOSABLE) ×16 IMPLANT
DRAPE COLUMN DVNC XI (DISPOSABLE) ×4 IMPLANT
DRAPE ROBOT W/ LEGGING 30X125 (DRAPES) ×4 IMPLANT
DRAPE STERI POUCH LG 24X46 STR (DRAPES) IMPLANT
DRSG TELFA 3X8 NADH STRL (GAUZE/BANDAGES/DRESSINGS) IMPLANT
ELECT REM PT RETURN 9FT ADLT (ELECTROSURGICAL) ×4
ELECTRODE REM PT RTRN 9FT ADLT (ELECTROSURGICAL) ×4 IMPLANT
FORCEPS BPLR FENES DVNC XI (FORCEP) ×4 IMPLANT
GAUZE 4X4 16PLY ~~LOC~~+RFID DBL (SPONGE) ×4 IMPLANT
GLOVE BIO SURGEON STRL SZ7 (GLOVE) ×16 IMPLANT
GLOVE INDICATOR 7.5 STRL GRN (GLOVE) ×16 IMPLANT
GLOVE SURG SYN 8.0 (GLOVE) ×4 IMPLANT
GLOVE SURG SYN 8.0 PF PI (GLOVE) ×4 IMPLANT
GOWN STRL REUS W/ TWL LRG LVL3 (GOWN DISPOSABLE) ×24 IMPLANT
GOWN STRL REUS W/ TWL XL LVL3 (GOWN DISPOSABLE) ×4 IMPLANT
GOWN STRL REUS W/TWL LRG LVL3 (GOWN DISPOSABLE) ×24
GOWN STRL REUS W/TWL XL LVL3 (GOWN DISPOSABLE) ×4
HEMOSTAT ARISTA ABSORB 3G PWDR (HEMOSTASIS) IMPLANT
IRRIGATION STRYKERFLOW (MISCELLANEOUS) IMPLANT
IRRIGATOR STRYKERFLOW (MISCELLANEOUS) ×4
IV LACTATED RINGERS 1000ML (IV SOLUTION) ×4 IMPLANT
IV NS 1000ML (IV SOLUTION) ×4
IV NS 1000ML BAXH (IV SOLUTION) ×4 IMPLANT
KIT PINK PAD W/HEAD ARE REST (MISCELLANEOUS) ×4
KIT PINK PAD W/HEAD ARM REST (MISCELLANEOUS) ×4 IMPLANT
KIT TURNOVER CYSTO (KITS) ×4 IMPLANT
L-HOOK LAP DISP 36CM (ELECTROSURGICAL)
LABEL OR SOLS (LABEL) ×4 IMPLANT
LHOOK LAP DISP 36CM (ELECTROSURGICAL) IMPLANT
LIGASURE VESSEL 5MM BLUNT TIP (ELECTROSURGICAL) IMPLANT
MANIFOLD NEPTUNE II (INSTRUMENTS) ×4 IMPLANT
MANIPULATOR UTERINE 4.5 ZUMI (MISCELLANEOUS) IMPLANT
MANIPULATOR VCARE LG CRV RETR (MISCELLANEOUS) IMPLANT
MANIPULATOR VCARE SML CRV RETR (MISCELLANEOUS) IMPLANT
MANIPULATOR VCARE STD CRV RETR (MISCELLANEOUS) IMPLANT
NDL DRIVE SUT CUT DVNC (INSTRUMENTS) ×4 IMPLANT
NEEDLE DRIVE SUT CUT DVNC (INSTRUMENTS) ×4 IMPLANT
NS IRRIG 1000ML POUR BTL (IV SOLUTION) ×4 IMPLANT
NS IRRIG 500ML POUR BTL (IV SOLUTION) ×4 IMPLANT
OBTURATOR OPTICAL STND 8 DVNC (TROCAR) ×4
OBTURATOR OPTICALSTD 8 DVNC (TROCAR) ×4 IMPLANT
OCCLUDER COLPOPNEUMO (BALLOONS) ×4 IMPLANT
PACK DNC HYST (MISCELLANEOUS) ×4 IMPLANT
PACK GYN LAPAROSCOPIC (MISCELLANEOUS) ×4 IMPLANT
PAD OB MATERNITY 4.3X12.25 (PERSONAL CARE ITEMS) ×4 IMPLANT
PAD PREP OB/GYN DISP 24X41 (PERSONAL CARE ITEMS) ×4 IMPLANT
SCISSORS METZENBAUM CVD 33 (INSTRUMENTS) IMPLANT
SCISSORS MNPLR CVD DVNC XI (INSTRUMENTS) ×4 IMPLANT
SCRUB CHG 4% DYNA-HEX 4OZ (MISCELLANEOUS) ×4 IMPLANT
SEAL UNIV 5-12 XI (MISCELLANEOUS) ×12 IMPLANT
SEALER VESSEL EXT DVNC XI (MISCELLANEOUS) ×4 IMPLANT
SET CYSTO W/LG BORE CLAMP LF (SET/KITS/TRAYS/PACK) ×4 IMPLANT
SET TUBE FILTERED XL AIRSEAL (SET/KITS/TRAYS/PACK) ×4 IMPLANT
SET TUBE SMOKE EVAC HIGH FLOW (TUBING) ×4 IMPLANT
SLEEVE Z-THREAD 5X100MM (TROCAR) ×4 IMPLANT
SOL ELECTROSURG ANTI STICK (MISCELLANEOUS) ×4
SOL PREP PVP 2OZ (MISCELLANEOUS) ×4
SOLUTION ELECTROSURG ANTI STCK (MISCELLANEOUS) ×4 IMPLANT
SOLUTION PREP PVP 2OZ (MISCELLANEOUS) ×4 IMPLANT
STRIP CLOSURE SKIN 1/4X4 (GAUZE/BANDAGES/DRESSINGS) IMPLANT
SURGILUBE 2OZ TUBE FLIPTOP (MISCELLANEOUS) ×4 IMPLANT
SUT MNCRL 4-0 (SUTURE) ×4
SUT MNCRL 4-0 27XMFL (SUTURE) ×4
SUT MNCRL AB 4-0 PS2 18 (SUTURE) ×4 IMPLANT
SUT VIC AB 0 CT2 27 (SUTURE) ×8 IMPLANT
SUT VLOC 180 0 6IN GS21 (SUTURE) IMPLANT
SUT VLOC 90 2/L VL 12 GS22 (SUTURE) ×4 IMPLANT
SUTURE MNCRL 4-0 27XMF (SUTURE) ×4 IMPLANT
SYR 50ML LL SCALE MARK (SYRINGE) IMPLANT
SYR 5ML LL (SYRINGE) IMPLANT
SYS BAG RETRIEVAL 10MM (BASKET)
SYSTEM BAG RETRIEVAL 10MM (BASKET) IMPLANT
TOWEL OR 17X26 4PK STRL BLUE (TOWEL DISPOSABLE) ×4 IMPLANT
TRAP FLUID SMOKE EVACUATOR (MISCELLANEOUS) ×4 IMPLANT
TROCAR Z-THREAD FIOS 5X100MM (TROCAR) ×4 IMPLANT
TUBING ART PRESS 48 MALE/FEM (TUBING) IMPLANT
WATER STERILE IRR 500ML POUR (IV SOLUTION) ×4 IMPLANT

## 2022-09-23 NOTE — Anesthesia Preprocedure Evaluation (Addendum)
Anesthesia Evaluation  Patient identified by MRN, date of birth, ID band Patient awake    Reviewed: Allergy & Precautions, NPO status , Patient's Chart, lab work & pertinent test results  History of Anesthesia Complications Negative for: history of anesthetic complications  Airway Mallampati: II  TM Distance: >3 FB Neck ROM: full    Dental no notable dental hx.    Pulmonary former smoker   Pulmonary exam normal        Cardiovascular negative cardio ROS Normal cardiovascular exam     Neuro/Psych Seizures - (last seizure last month), Well Controlled,  PSYCHIATRIC DISORDERS Anxiety Depression       GI/Hepatic Neg liver ROS,GERD  Controlled,,  Endo/Other  negative endocrine ROS    Renal/GU      Musculoskeletal   Abdominal   Peds  Hematology  (+) Blood dyscrasia, anemia   Anesthesia Other Findings Past Medical History: No date: Anemia No date: Anxiety No date: Constipation No date: Depression No date: Dyspareunia No date: Family history of adverse reaction to anesthesia     Comment:  father wakes up combative No date: GERD (gastroesophageal reflux disease) No date: Headache No date: Onychomycosis No date: Pneumonia No date: Seizures [facility])  Past Surgical History: 12/22/2014: COLONOSCOPY; N/A     Comment:  Procedure: COLONOSCOPY;  Surgeon: Wallace Cullens, MD;                Location: [facility] ENDOSCOPY;  Service: Gastroenterology;                Laterality: N/A; 12/22/2014: ESOPHAGOGASTRODUODENOSCOPY (EGD) WITH PROPOFOL; N/A     Comment:  Procedure: ESOPHAGOGASTRODUODENOSCOPY (EGD) WITH               PROPOFOL;  Surgeon: Wallace Cullens, MD;  Location: [facility]               ENDOSCOPY;  Service: Gastroenterology;  Laterality: N/A; No date: LEEP  BMI    Body Mass Index: 32.79 kg/m      Reproductive/Obstetrics negative OB ROS                              Anesthesia Physical Anesthesia Plan  ASA:  2  Anesthesia Plan: General ETT   Post-op Pain Management: Tylenol PO (pre-op)*, Gabapentin PO (pre-op)*, Toradol IV (intra-op)*, Dilaudid IV and Ketamine IV*   Induction: Intravenous  PONV Risk Score and Plan: 3 and Ondansetron, Dexamethasone, Midazolam and Treatment may vary due to age or medical condition  Airway Management Planned: Oral ETT  Additional Equipment:   Intra-op Plan:   Post-operative Plan: Extubation in OR  Informed Consent: I have reviewed the patients History and Physical, chart, labs and discussed the procedure including the risks, benefits and alternatives for the proposed anesthesia with the patient or authorized representative who has indicated his/her understanding and acceptance.     Dental Advisory Given  Plan Discussed with: Anesthesiologist, CRNA and Surgeon  Anesthesia Plan Comments: (Patient consented for risks of anesthesia including but not limited to:  - adverse reactions to medications - damage to eyes, teeth, lips or other oral mucosa - nerve damage due to positioning  - sore throat or hoarseness - Damage to heart, brain, nerves, lungs, other parts of body or loss of life  Patient voiced understanding.)         Anesthesia Quick Evaluation

## 2022-09-23 NOTE — Interval H&P Note (Signed)
History and Physical Interval Note:  09/23/2022 7:22 AM  Janet Summers  has presented today for surgery, with the diagnosis of chronic pelvic pain.  The various methods of treatment have been discussed with the patient and family. After consideration of risks, benefits and other options for treatment, the patient has consented to  Procedure(s): XI ROBOTIC ASSISTED LAPAROSCOPIC HYSTERECTOMY AND SALPINGECTOMY (Bilateral) LYSIS OF ADHESION (N/A) LAPAROSCOPY DIAGNOSTIC EXCISION OF ENDOMETRIOSIS (N/A) INTRAUTERINE DEVICE (IUD) REMOVAL (N/A) CYSTOSCOPY WITH HYDRO DISTENSION (N/A) as a surgical intervention.  The patient's history has been reviewed, patient examined, no change in status, stable for surgery.  I have reviewed the patient's chart and labs.  Questions were answered to the patient's satisfaction.     Christeen Douglas

## 2022-09-23 NOTE — Anesthesia Procedure Notes (Signed)
Procedure Name: Intubation Date/Time: 09/23/2022 8:10 AM  Performed by: Lysbeth Penner, CRNAPre-anesthesia Checklist: Patient identified, Emergency Drugs available, Suction available and Patient being monitored Patient Re-evaluated:Patient Re-evaluated prior to induction Oxygen Delivery Method: Circle system utilized Preoxygenation: Pre-oxygenation with 100% oxygen Induction Type: IV induction Ventilation: Mask ventilation without difficulty Laryngoscope Size: McGraph and 3 Grade View: Grade I Tube type: Oral Tube size: 6.5 mm Number of attempts: 1 Airway Equipment and Method: Stylet and Oral airway Placement Confirmation: ETT inserted through vocal cords under direct vision, positive ETCO2 and breath sounds checked- equal and bilateral Secured at: 21 cm Tube secured with: Tape Dental Injury: Teeth and Oropharynx as per pre-operative assessment

## 2022-09-23 NOTE — Transfer of Care (Signed)
Immediate Anesthesia Transfer of Care Note  Patient: Janet Summers  Procedure(s) Performed: XI ROBOTIC ASSISTED LAPAROSCOPIC HYSTERECTOMY AND SALPINGECTOMY (Bilateral: Abdomen) LYSIS OF ADHESION (Abdomen) LAPAROSCOPY DIAGNOSTIC EXCISION OF ENDOMETRIOSIS (Abdomen) INTRAUTERINE DEVICE (IUD) REMOVAL (Uterus) CYSTOSCOPY WITH HYDRO DISTENSION (Urethra)  Patient Location: PACU  Anesthesia Type:General  Level of Consciousness: drowsy and responds to stimulation  Airway & Oxygen Therapy: Patient Spontanous Breathing  Post-op Assessment: Report given to RN and Post -op Vital signs reviewed and stable  Post vital signs: Reviewed and stable  Last Vitals:  Vitals Value Taken Time  BP 107/51 09/23/22 1021  Temp 36.5 C 09/23/22 1021  Pulse 70 09/23/22 1026  Resp 14 09/23/22 1026  SpO2 93 % 09/23/22 1026  Vitals shown include unvalidated device data.  Last Pain:  Vitals:   09/23/22 1021  TempSrc:   PainSc: Asleep         Complications: No notable events documented.

## 2022-09-23 NOTE — Anesthesia Postprocedure Evaluation (Signed)
Anesthesia Post Note  Patient: Janet Summers  Procedure(s) Performed: XI ROBOTIC ASSISTED LAPAROSCOPIC HYSTERECTOMY AND SALPINGECTOMY (Bilateral: Abdomen) LYSIS OF ADHESION (Abdomen) LAPAROSCOPY DIAGNOSTIC EXCISION OF ENDOMETRIOSIS (Abdomen) INTRAUTERINE DEVICE (IUD) REMOVAL (Uterus) CYSTOSCOPY WITH HYDRO DISTENSION (Urethra)  Patient location during evaluation: PACU Anesthesia Type: General Level of consciousness: awake and alert Pain management: pain level controlled Vital Signs Assessment: post-procedure vital signs reviewed and stable Respiratory status: spontaneous breathing, nonlabored ventilation, respiratory function stable and patient connected to nasal cannula oxygen Cardiovascular status: blood pressure returned to baseline and stable Postop Assessment: no apparent nausea or vomiting Anesthetic complications: no  No notable events documented.   Last Vitals:  Vitals:   09/23/22 1045 09/23/22 1050  BP: 114/78   Pulse: 70 62  Resp: 17 15  Temp:    SpO2: 99% 97%    Last Pain:  Vitals:   09/23/22 1050  TempSrc:   PainSc: 6                  Stephanie Coup

## 2022-09-23 NOTE — Discharge Instructions (Addendum)
Discharge instructions after  robotically-assisted total laparoscopic hysterectomy   For the next three days, take ibuprofen and acetaminophen on a schedule, every 8 hours. You can take them together or you can intersperse them, and take one every four hours. I also gave you gabapentin for nighttime, to help you sleep and also to control pain. Take gabapentin medicines at night for at least the next 3 nights. You also have a narcotic, oxycodone, to take as needed if the above medicines don't help.  Postop constipation is a major cause of pain. Stay well hydrated, walk as you tolerate, and take over the counter senna as well as stool softeners if you need them.   Signs and Symptoms to Report Call our office at [phone] if you have any of the following.   Fever over 100.4 degrees or higher  Severe stomach pain not relieved with pain medications  Bright red bleeding that's heavier than a period that does not slow with rest  To go the bathroom a lot (frequency), you can't hold your urine (urgency), or it hurts when you empty your bladder (urinate)  Chest pain  Shortness of breath  Pain in the calves of your legs  Severe nausea and vomiting not relieved with anti-nausea medications  Signs of infection around your wounds, such as redness, hot to touch, swelling, green/yellow drainage (like pus), bad smelling discharge  Any concerns  What You Can Expect after Surgery  You may see some pink tinged, bloody fluid and bruising around the wound. This is normal.  You may notice shoulder and neck pain. This is caused by the gas used during surgery to expand your abdomen so your surgeon could get to the uterus easier.  You may have a sore throat because of the tube in your mouth during general anesthesia. This will go away in 2 to 3 days.  You may have some stomach cramps.  You may notice spotting on your panties.  You may have pain around the incision sites.   Activities after Your  Discharge Follow these guidelines to help speed your recovery at home:  Do the coughing and deep breathing as you did in the hospital for 2 weeks. Use the small blue breathing device, called the incentive spirometer for 2 weeks.  Don't drive if you are in pain or taking narcotic pain medicine. You may drive when you can safely slam on the brakes, turn the wheel forcefully, and rotate your torso comfortably. This is typically 1-2 weeks. Practice in a parking lot or side street prior to attempting to drive regularly.   Ask others to help with household chores for 4 weeks.  Do not lift anything heavier that 10 pounds for 4-6 weeks. This includes pets, children, and groceries.  Don't do strenuous activities, exercises, or sports like vacuuming, tennis, squash, etc. until your doctor says it is safe to do so. ---Maintain pelvic rest for 12 weeks. This means nothing in the vagina or rectum at all (no douching, tampons, intercourse) for 12 weeks.   Walk as you feel able. Rest often since it may take two or three weeks for your energy level to return to normal.   You may climb stairs  Avoid constipation:   -Eat fruits, vegetables, and whole grains. Eat small meals as your appetite will take time to return to normal.   -Drink 6 to 8 glasses of water each day unless your doctor has told you to limit your fluids.   -Use a laxative or   stool softener as needed if constipation becomes a problem. You may take Miralax, metamucil, Citrucil, Colace, Senekot, FiberCon, etc. If this does not relieve the constipation, try two tablespoons of Milk Of Magnesia every 8 hours until your bowels move.   You may shower. Gently wash the wounds with a mild soap and water. Pat dry.  Do not get in a hot tub, swimming pool, etc. for 6 weeks.  Do not use lotions, oils, powders on the wounds.  Do not douche, use tampons, or have sex until your doctor says it is okay.  Take your pain medicine when you need it. The medicine may not  work as well if the pain is bad.  Take the medicines you were taking before surgery. Other medications you will need are pain medications (Norco or Percocet) and nausea medications (Zofran).  AMBULATORY SURGERY  DISCHARGE INSTRUCTIONS   The drugs that you were given will stay in your system until tomorrow so for the next 24 hours you should not:  Drive an automobile Make any legal decisions Drink any alcoholic beverage   You may resume regular meals tomorrow.  Today it is better to start with liquids and gradually work up to solid foods.  You may eat anything you prefer, but it is better to start with liquids, then soup and crackers, and gradually work up to solid foods.   Please notify your doctor immediately if you have any unusual bleeding, trouble breathing, redness and pain at the surgery site, drainage, fever, or pain not relieved by medication.    Your post-operative visit with Dr.                                       is: Date:                        Time:    Please call to schedule your post-operative visit.  Additional Instructions: 

## 2022-09-23 NOTE — Op Note (Addendum)
Janet Summers PROCEDURE DATE: 09/23/2022  PREOPERATIVE DIAGNOSIS: Chronic pelvic pain POSTOPERATIVE DIAGNOSIS: Endometriosis, stage 3 PROCEDURE:  XI ROBOTIC ASSISTED LAPAROSCOPIC HYSTERECTOMY AND SALPINGECTOMY:  LYSIS OF ADHESION: [id number] LAPAROSCOPY DIAGNOSTIC EXCISION OF ENDOMETRIOSIS: 49320 (CPT) INTRAUTERINE DEVICE (IUD) REMOVAL: 45409 (CPT) CYSTOSCOPY WITH HYDRO DISTENSION: 52000 (CPT)  SURGEON:  Dr. Christeen Douglas, MD ASSISTANT:  Margaretmary Eddy, CNM  Anesthesiologist:  Anesthesiologist: Louie Boston, MD; Stephanie Coup, MD CRNA: Jeanine Luz, CRNA; Lysbeth Penner, CRNA  INDICATIONS: 42 y.o. F here for definitive surgical management secondary to the indications listed under preoperative diagnoses; please see preoperative note for further details.  Risks of surgery were discussed with the patient including but not limited to: bleeding which may require transfusion or reoperation; infection which may require antibiotics; injury to bowel, bladder, ureters or other surrounding organs; need for additional procedures; thromboembolic phenomenon, incisional problems and other postoperative/anesthesia complications. Written informed consent was obtained.    FINDINGS:   Pelvic: External genitalia negative for lesions. Vagina negative. Adnexa negative for masses or nodularity. Cervix without gross lesions. Uterus mobile, anteverted, small.   Intraoperative findings revealed a normal upper abdomen including bowel, diaphragmatic surfaces, stomach, and omentum. However, the peritoneum in the pelvis was dotted with fibrotic tissue.  The uterus was small and mobile.  The right and left ovaries were scarred into the ovarian fossae.  Bilateral tubes appeared normal except the endometriosis adhesions to the ovaries.  Appendix wnl  The bowels appeared to have non-inflammed diverticula and endometriosis scarring.  No Hunner's ulcers noted in the bladder after hydrodistension.     ANESTHESIA:    General INTRAVENOUS FLUIDS:800  ml ESTIMATED BLOOD LOSS:5 ml URINE OUTPUT: 500 ml  SPECIMENS: Uterus, cervix, bilateral fallopian tubes and multiple areas of endometriosis scarring that was excised.  COMPLICATIONS: None immediate   RATLH/BS: PROCEDURE IN DETAIL: After informed consent was obtained, the patient was taken to the operating room where general anesthesia was obtained without difficulty. The patient was positioned in the dorsal lithotomy position in [place] stirrups and her arms were carefully tucked at her sides and the usual precautions were taken. Deep Trendelenburg (20-25 deg) was established to confirm that she does not shift on the table.  She was prepped and draped in normal sterile fashion.  Time-out was performed and a Foley catheter was placed into the bladder. The IUD strings were identified and the IUD removed without complication in standard fashion.  A standard VCare uterine manipulator was then placed in the uterus without incident.  Preoperative prophylactic antibiotics were given through her iv.  After infiltration of local anesthetic at the proposed trocar sites, an 8 mm incision was created at the umbilicus, and an AirSeal 5mm was placed under direct visualization, after confirmation of OG tube working well. Pneumoperitoneum was created to a pressure of 15 mm Hg. The camera was placed and the abdomin surveyed, noting intact bowel below the site of entry. A survey of the pelvis and upper abdomen revealed the above findings. Two right and one left lateral 8-mm robotic ports were placed under direct visualization.  The patient was placed in deepTrendelenburg and the bowel was displaced up into the upper abdomen. The robot was left side docked. The instruments were placed under direct visualization.   The ureters were identified bilaterally coursing outside of the operative field. Round ligaments were divided on each side with the EndoShears and the  retroperitoneal space was opened bilaterally. The posterior leaflet of the broad was taken down to the level of  the IP ligament. The anterior leaflet of the broad ligament was carefully taken down to the midline.  A bladder flap was created and the bladder was dissected down off the lower uterine segment and cervix using endoshears and electrocautery.    The Fallopian tubes were divided from the ovaries, and care taken to hemostatically transect the utero-ovarian ligament. The peritoneum was taken down to the level of the internal os, and the uterine arteries skeletonized. With strong cephalad pressure from the V-care, bipolar cautery was used to seal and transect the uterine arteries, and the pedicles allowed to fall away laterally.  A colpotomy was performed circumferentially along the V-Care ring with monopolar electrocautery and the cervix was incised from the vagina using the laparoscopic scissors. The specimen was removed through the vagina.  A pneumo balloon was placed in the vagina and the vaginal cuff was then closed in a running continuous fashion using the  0 V-Lock suture with careful attention to include the vaginal cuff angles, the uterosacral ligaments and the vaginal mucosa within the closure.  Hemostasis was secured with intraabdominal pressure and review of all surgical sites. The intraperitoneal pressure was dropped, and all planes of dissection, vascular pedicles and the vaginal cuff were found to be hemostatic.  The robot was undocked.    Attention was turned to the bladder and cystoscopy showed vigorous bilateral ureteral jets.  No stitches or endometriosis implants were visualized in the bladder during cystoscopy. Hydrodistention with for 4 min performed for chronic pelvic pain. No Hunner's ulcers noted.  The lateral trocars were removed under visualization.  The CO2 gas was released and several deep breaths given to remove any remaining CO2 from the peritoneal cavity.   The skin incisions were closed with 4-0 Monocryl subcuticular stitch and Dermabond.     Anesthesia was reversed without difficulty.  The patient tolerated the procedure well.  Sponge, lap and needle counts were correct x2.  The patient was taken to recovery room in excellent condition.

## 2022-10-11 ENCOUNTER — Encounter: Payer: Self-pay | Admitting: Obstetrics and Gynecology

## 2022-12-19 ENCOUNTER — Other Ambulatory Visit: Payer: Self-pay | Admitting: Podiatry

## 2023-01-12 ENCOUNTER — Encounter: Payer: Self-pay | Admitting: Podiatry

## 2023-01-12 ENCOUNTER — Ambulatory Visit: Payer: 59 | Admitting: Podiatry

## 2023-01-12 VITALS — BP 112/72 | HR 86 | Resp 18 | Ht 64.0 in | Wt 191.0 lb

## 2023-01-12 DIAGNOSIS — L603 Nail dystrophy: Secondary | ICD-10-CM

## 2023-01-12 MED ORDER — TERBINAFINE HCL 250 MG PO TABS: 250.0000 mg | ORAL_TABLET | Freq: Every day | ORAL | 0 refills | Status: AC

## 2023-01-16 NOTE — Progress Notes (Signed)
She presents today states that there is no pain since her last visit.  She denies fever chills nausea mobic muscle pains calf pain back pain chest pain shortness of breath.  States that her nails are looking much better.  Objective: Vital signs stable alert oriented x 3 nails are improving considerably.  Assessment: Resolving onychomycosis with the use of Lamisil.  Plan: She will continue the use of Lamisil however she is going to be dispense 30 tablets and she will take 1 tablet by mouth every other day and I will follow-up with her in 3 months.

## 2023-04-13 ENCOUNTER — Ambulatory Visit: Payer: 59 | Admitting: Podiatry

## 2023-04-13 NOTE — Therapy (Signed)
 OUTPATIENT PHYSICAL THERAPY FEMALE PELVIC EVALUATION   Patient Name: Janet Summers MRN: [id number] DOB:[birth year], 43 y.o., female Today's Date: 04/14/2023  END OF SESSION:  PT End of Session - 04/14/23 0849     Visit Number 1    Date for PT Re-Evaluation 10/12/23    Authorization Type [employer]    PT Start Time 0845    PT Stop Time 0925    PT Time Calculation (min) 40 min    Activity Tolerance Patient tolerated treatment well    Behavior During Therapy [facility] for tasks assessed/performed             Past Medical History:  Diagnosis Date   Anemia    Anxiety    Constipation    Depression    Dyspareunia    Family history of adverse reaction to anesthesia    father wakes up combative   GERD (gastroesophageal reflux disease)    Headache    Onychomycosis    Pneumonia    Seizures (HCC)    Past Surgical History:  Procedure Laterality Date   COLONOSCOPY N/A 12/22/2014   Procedure: COLONOSCOPY;  Surgeon: Deward CINDERELLA Piedmont, MD;  Location: [facility] ENDOSCOPY;  Service: Gastroenterology;  Laterality: N/A;   CYSTOSCOPY N/A 09/23/2022   Procedure: CYSTOSCOPY WITH HYDRO DISTENSION;  Surgeon: Verdon Keen, MD;  Location: [facility] ORS;  Service: Gynecology;  Laterality: N/A;   ESOPHAGOGASTRODUODENOSCOPY (EGD) WITH PROPOFOL  N/A 12/22/2014   Procedure: ESOPHAGOGASTRODUODENOSCOPY (EGD) WITH PROPOFOL ;  Surgeon: Deward CINDERELLA Piedmont, MD;  Location: [facility] ENDOSCOPY;  Service: Gastroenterology;  Laterality: N/A;   IUD REMOVAL N/A 09/23/2022   Procedure: INTRAUTERINE DEVICE (IUD) REMOVAL;  Surgeon: Verdon Keen, MD;  Location: [facility] ORS;  Service: Gynecology;  Laterality: N/A;   LAPAROSCOPY N/A 09/23/2022   Procedure: LAPAROSCOPY DIAGNOSTIC EXCISION OF ENDOMETRIOSIS;  Surgeon: Verdon Keen, MD;  Location: [facility] ORS;  Service: Gynecology;  Laterality: N/A;   LEEP     LYSIS OF ADHESION N/A 09/23/2022   Procedure: LYSIS OF ADHESION;  Surgeon: Verdon Keen, MD;  Location: [facility] ORS;  Service: Gynecology;   Laterality: N/A;   ROBOTIC ASSISTED LAPAROSCOPIC HYSTERECTOMY AND SALPINGECTOMY Bilateral 09/23/2022   Procedure: XI ROBOTIC ASSISTED LAPAROSCOPIC HYSTERECTOMY AND SALPINGECTOMY;  Surgeon: Verdon Keen, MD;  Location: [facility] ORS;  Service: Gynecology;  Laterality: Bilateral;   Patient Active Problem List   Diagnosis Date Noted   Seizure disorder (HCC) 02/18/2019   Chronic constipation 07/09/2014   HGSIL (high grade squamous intraepithelial lesion) on Pap smear of cervix 07/09/2014   Postcoital bleeding 07/09/2014    PCP: none  REFERRING PROVIDER: Verdon Keen, MD   REFERRING DIAG:  R10.2 (ICD-10-CM) - Pelvic and perineal pain  N80.9 (ICD-10-CM) - Endometriosis, unspecified  N39.46 (ICD-10-CM) - Mixed incontinence  N32.81 (ICD-10-CM) - Overactive bladder    THERAPY DIAG:  Other lack of coordination  Pelvic pain  Lower abdominal pain  Rationale for Evaluation and Treatment: Rehabilitation  ONSET DATE: 2016  SUBJECTIVE:  SUBJECTIVE STATEMENT: Suddenly had pain in the lower abdomen. Patient is trying to use the vaginal valium.  PAIN:  Are you having pain? Yes NPRS scale: 7/10 Pain location:  abdomen and hips  Pain type: stabbing, pulling, squeezing, cutting Pain description: constant 2/3 and intermittent is 10/10   Aggravating factors: penile penetration vaginally, lifting heavy items Relieving factors: ice  PRECAUTIONS: None  RED FLAGS: None   WEIGHT BEARING RESTRICTIONS: No  FALLS:  Has patient fallen in last 6 months? Yes. Number of falls tripped over dogs and not due to balance and 1 time due to seizures  LIVING ENVIRONMENT: Lives with: lives with their family   OCCUPATION: sitting job  PLOF: Independent  PATIENT GOALS: reduce the pain, stronger  PERTINENT  HISTORY:  RA TLH and Salpingectomy, LOA and Excision of endometriosis and cystoscopy with hydro distension on 09/23/22 for chronic pelvic pain;  Constipation due to slow transit ; seizures Sexual abuse: No  BOWEL MOVEMENT: Pain with bowel movement: Yes, intermittent 3-4/10 Type of bowel movement:Type (Bristol Stool Scale) Type 2, 3, 4, Frequency every other day, Strain Yes, and Splinting no Fully empty rectum: Yes:   Leakage: No Pads: No Fiber supplement: No  URINATION: Pain with urination: Yes, intermittent 7/10 and located in the lower abdominal area Fully empty bladder: Yes:   Stream: Strong Urgency: Yes: needs to get to the bathroom and leaks urine as she goes to the bathroom Frequency: can be every 30 minutes Leakage: Urge to void, Walking to the bathroom, Laughing, Lifting, and Bending forward; when leaks it is fully emptying her bladder Pads: No  INTERCOURSE: Pain with intercourse:  stopped intercourse due to the pain level  and last time was 10 months  PREGNANCY: Vaginal deliveries 2 Tearing Yes:      OBJECTIVE:  Note: Objective measures were completed at Evaluation unless otherwise noted.  DIAGNOSTIC FINDINGS:  Operative report and pathology were reviewed. Operative findings: Endometriosis stage 3  Pelvic: External genitalia negative for lesions. Vagina negative. Adnexa negative for masses or nodularity. Cervix without gross lesions. Uterus mobile, anteverted, small.   Intraoperative findings revealed a normal upper abdomen including bowel, diaphragmatic surfaces, stomach, and omentum. However, the peritoneum in the pelvis was dotted with fibrotic tissue.  The uterus was small and mobile.  The right and left ovaries were scarred into the ovarian fossae.  Bilateral tubes appeared normal except the endometriosis adhesions to the ovaries.  Appendix wnl  The bowels appeared to have non-inflammed diverticula and endometriosis scarring.  No Hunner's ulcers noted in  the bladder after hydrodistension   PATIENT SURVEYS:  PFIQ-7 186 UIQ-7 81 POPIQ-7 100 CRAIQ-7 5  COGNITION: Overall cognitive status: Within functional limits for tasks assessed     SENSATION: Light touch: Appears intact Proprioception: Appears intact     POSTURE: No Significant postural limitations  PELVIC ALIGNMENT:  LUMBARAROM/PROM: lumbar ROM is full    LOWER EXTREMITY ROM: bilateral hip ROM is full   LOWER EXTREMITY MMT: bilateral hip strength 5/5   PALPATION:   General  tenderness located in the abdomen, along the lumbar and gluteals, decreased movement of the lower rib cage                External Perineal Exam tenderness located on the ischiocavernosus, bulbocavernosus, perineal body, levator ani, and obturator internist, and around the anus, Q-tip test with pain at 7, 1, 5, and 6 O'clock;  Internal Pelvic Floor pain with inserting the Q-tip therefore did not assess the pelvic floor internally  Patient confirms identification and approves PT to assess internal pelvic floor and treatment Yes  PELVIC MMT:   MMT eval  Vaginal   Internal Anal Sphincter   External Anal Sphincter   Puborectalis   Diastasis Recti   (Blank rows = not tested)        TONE: increased  PROLAPSE: none  TODAY'S TREATMENT:                                                                                                                              DATE: 04/14/23  EVAL See below   PATIENT EDUCATION:  04/14/23 Education details: you tube videos for pelvic floor stretches and meditation Person educated: Patient Education method: Explanation, Demonstration, Tactile cues, Verbal cues, and Handouts Education comprehension: verbalized understanding, returned demonstration, verbal cues required, tactile cues required, and needs further education  HOME EXERCISE PROGRAM: See above  ASSESSMENT:  CLINICAL IMPRESSION: Patient is a 43 y.o. female who was  seen today for physical therapy evaluation and treatment for pelvic and perineal pain, overactive bladder, mixed incontinence and endometriosis. Patient had RA TLH and Salpingectomy, LOA and Excision of endometriosis and cystoscopy with hydro distension on 09/23/22. Patient reports pain level is 7-10 in abdomen and hips with urination, lifting and vaginal penetration. She has intermittent rectal pain with bowel movement at level 3-4/10 and has to strain. Her bowel movements are Type 2, 3, 4. She leaks a full bladder with urge to void, walking to the bathroom, laughing, lifting, and bending forward. She has to go to the bathroom every 30 minutes. She has tenderness throughout her abdomen, along her lumbar spine, gluteals, bulbocavernosus, ischiocavernosus, levator ani, obturator internist, perineal body, and around the anus. Q-tip test with tenderness along 7, 1, 5, and 6 O'clock. Pain with placing the Q-tip into the vagina so did not assess the pelvic floor internally. Patient would benefit from skilled therapy to reduce her pain to improve her quality of life.   OBJECTIVE IMPAIRMENTS: decreased coordination, decreased endurance, decreased strength, increased fascial restrictions, increased muscle spasms, and pain.   ACTIVITY LIMITATIONS: lifting, bending, continence, toileting, and locomotion level  PARTICIPATION LIMITATIONS: meal prep, cleaning, laundry, interpersonal relationship, driving, shopping, community activity, and occupation  PERSONAL FACTORS: Fitness and 3+ comorbidities: RA TLH and Salpingectomy, LOA and Excision of endometriosis and cystoscopy with hydro distension on 09/23/22 for chronic pelvic pain;  Constipation due to slow transit ; seizures  are also affecting patient's functional outcome.   REHAB POTENTIAL: Excellent  CLINICAL DECISION MAKING: Evolving/moderate complexity  EVALUATION COMPLEXITY: Moderate   GOALS: Goals reviewed with patient? Yes  SHORT TERM GOALS: Target date:  05/12/23  Patient independent with initial HEP for stretches and meditation.  Baseline: not educated yet Goal status: INITIAL  2.  Patient able to perform diaphragmatic breathing to perform a pelvic floor drop.  Baseline: not educated  yet Goal status: INITIAL  3.  Patient educated on how to breath, relax the pelvic floor and generate pressure to push the stool out when having a bowel movement.  Baseline: not educated yet Goal status: INITIAL  4.  Patient educated on abdominal massage to work on peristalic motion of the intestines.  Baseline: not educated yet.  Goal status: INITIAL   LONG TERM GOALS: Target date: 10/12/23  Patient independent with advanced HEP for core and pelvic floor.  Baseline: not educated yet Goal status: INITIAL  2.  Patient is able to walk to the bathroom without leaking urine due to the ability to fully relax and contract her pelvic floor to stop the flow of urine.  Baseline: She leaks as she is walking to the bathroom Goal status: INITIAL  3.  Patient is able to wait 1.5 to 2 hours to urinate due to the improved mobility of the diaphragm, tissue around the bladder and reduction of pain </= 4/10 so her bladder can fully fill.  Baseline: urinates every 30 minutes Goal status: INITIAL  4.  Patient reports she strains with bowel movements </= 50% of the time with pain level </= 2/10 Baseline: always strains and pain is 4/10 Goal status: INITIAL  5.  Patient understands ways to manage her pain with meditation, using a vaginal wand to massage the muscles, ice, heat, stretching.  Baseline: not educated yet Goal status: INITIAL    PLAN:  PT FREQUENCY: 1-2x/week  PT DURATION: 6 months  PLANNED INTERVENTIONS: 97110-Therapeutic exercises, 97530- Therapeutic activity, 97112- Neuromuscular re-education, 97140- Manual therapy, 97014- Electrical stimulation (unattended), 97035- Ultrasound, Patient/Family education, Taping, Dry Needling, Joint mobilization,  Spinal mobilization, Cryotherapy, Moist heat, and Biofeedback  PLAN FOR NEXT SESSION: manual work to the abdomen and diaphragmatic breathing; work to the rib cage, supine squat to stretch the pelvic floor, educate on abdominal massage,    Channing Pereyra, PT 04/14/23 10:56 AM

## 2023-04-14 ENCOUNTER — Other Ambulatory Visit: Payer: Self-pay

## 2023-04-14 ENCOUNTER — Encounter: Payer: Self-pay | Admitting: Physical Therapy

## 2023-04-14 ENCOUNTER — Ambulatory Visit: Payer: 59 | Attending: Obstetrics and Gynecology | Admitting: Physical Therapy

## 2023-04-14 DIAGNOSIS — R1030 Lower abdominal pain, unspecified: Secondary | ICD-10-CM | POA: Diagnosis not present

## 2023-04-14 DIAGNOSIS — R102 Pelvic and perineal pain: Secondary | ICD-10-CM | POA: Diagnosis present

## 2023-04-14 DIAGNOSIS — N809 Endometriosis, unspecified: Secondary | ICD-10-CM | POA: Diagnosis present

## 2023-04-14 DIAGNOSIS — N3281 Overactive bladder: Secondary | ICD-10-CM | POA: Diagnosis not present

## 2023-04-14 DIAGNOSIS — N3946 Mixed incontinence: Secondary | ICD-10-CM | POA: Insufficient documentation

## 2023-04-14 DIAGNOSIS — R278 Other lack of coordination: Secondary | ICD-10-CM | POA: Insufficient documentation

## 2023-04-19 ENCOUNTER — Ambulatory Visit: Payer: 59 | Admitting: Physical Therapy

## 2023-04-19 DIAGNOSIS — R102 Pelvic and perineal pain: Secondary | ICD-10-CM | POA: Diagnosis not present

## 2023-04-19 DIAGNOSIS — R1030 Lower abdominal pain, unspecified: Secondary | ICD-10-CM

## 2023-04-19 DIAGNOSIS — R278 Other lack of coordination: Secondary | ICD-10-CM

## 2023-04-19 NOTE — Therapy (Signed)
 OUTPATIENT PHYSICAL THERAPY FEMALE PELVIC TREATMENT   Patient Name: Janet Summers MRN: [id number] DOB:December 13, 1980, 43 y.o., female Today's Date: 04/19/2023  END OF SESSION:  PT End of Session - 04/19/23 1618     Visit Number 2    Date for PT Re-Evaluation 10/12/23    Authorization Type [employer]    PT Start Time 1615    PT Stop Time 1655    PT Time Calculation (min) 40 min    Activity Tolerance Patient tolerated treatment well    Behavior During Therapy WFL for tasks assessed/performed             Past Medical History:  Diagnosis Date   Anemia    Anxiety    Constipation    Depression    Dyspareunia    Family history of adverse reaction to anesthesia    father wakes up combative   GERD (gastroesophageal reflux disease)    Headache    Onychomycosis    Pneumonia    Seizures (HCC)    Past Surgical History:  Procedure Laterality Date   COLONOSCOPY N/A 12/22/2014   Procedure: COLONOSCOPY;  Surgeon: Stephens Eis, MD;  Location: [facility] ENDOSCOPY;  Service: Gastroenterology;  Laterality: N/A;   CYSTOSCOPY N/A 09/23/2022   Procedure: CYSTOSCOPY WITH HYDRO DISTENSION;  Surgeon: Prescilla Brod, MD;  Location: [facility] ORS;  Service: Gynecology;  Laterality: N/A;   ESOPHAGOGASTRODUODENOSCOPY (EGD) WITH PROPOFOL  N/A 12/22/2014   Procedure: ESOPHAGOGASTRODUODENOSCOPY (EGD) WITH PROPOFOL ;  Surgeon: Stephens Eis, MD;  Location: [facility] ENDOSCOPY;  Service: Gastroenterology;  Laterality: N/A;   IUD REMOVAL N/A 09/23/2022   Procedure: INTRAUTERINE DEVICE (IUD) REMOVAL;  Surgeon: Prescilla Brod, MD;  Location: [facility] ORS;  Service: Gynecology;  Laterality: N/A;   LAPAROSCOPY N/A 09/23/2022   Procedure: LAPAROSCOPY DIAGNOSTIC EXCISION OF ENDOMETRIOSIS;  Surgeon: Prescilla Brod, MD;  Location: [facility] ORS;  Service: Gynecology;  Laterality: N/A;   LEEP     LYSIS OF ADHESION N/A 09/23/2022   Procedure: LYSIS OF ADHESION;  Surgeon: Prescilla Brod, MD;  Location: [facility] ORS;  Service: Gynecology;   Laterality: N/A;   ROBOTIC ASSISTED LAPAROSCOPIC HYSTERECTOMY AND SALPINGECTOMY Bilateral 09/23/2022   Procedure: XI ROBOTIC ASSISTED LAPAROSCOPIC HYSTERECTOMY AND SALPINGECTOMY;  Surgeon: Prescilla Brod, MD;  Location: [facility] ORS;  Service: Gynecology;  Laterality: Bilateral;   Patient Active Problem List   Diagnosis Date Noted   Seizure disorder (HCC) 02/18/2019   Chronic constipation 07/09/2014   HGSIL (high grade squamous intraepithelial lesion) on Pap smear of cervix 07/09/2014   Postcoital bleeding 07/09/2014    PCP: none  REFERRING PROVIDER: Prescilla Brod, MD   REFERRING DIAG:  R10.2 (ICD-10-CM) - Pelvic and perineal pain  N80.9 (ICD-10-CM) - Endometriosis, unspecified  N39.46 (ICD-10-CM) - Mixed incontinence  N32.81 (ICD-10-CM) - Overactive bladder    THERAPY DIAG:  Other lack of coordination  Pelvic pain  Lower abdominal pain  Rationale for Evaluation and Treatment: Rehabilitation  ONSET DATE: 2016  SUBJECTIVE:  SUBJECTIVE STATEMENT: No changes since the evaluation.   PAIN:  Are you having pain? Yes NPRS scale: 7/10 Pain location:  abdomen and hips  Pain type: stabbing, pulling, squeezing, cutting Pain description: constant 2/3 and intermittent is 10/10   Aggravating factors: penile penetration vaginally, lifting heavy items Relieving factors: ice  PRECAUTIONS: None  RED FLAGS: None   WEIGHT BEARING RESTRICTIONS: No  FALLS:  Has patient fallen in last 6 months? Yes. Number of falls tripped over dogs and not due to balance and 1 time due to seizures  LIVING ENVIRONMENT: Lives with: lives with their family   OCCUPATION: sitting job  PLOF: Independent  PATIENT GOALS: reduce the pain, stronger  PERTINENT HISTORY:  RA TLH and Salpingectomy, LOA and Excision  of endometriosis and cystoscopy with hydro distension on 09/23/22 for chronic pelvic pain;  Constipation due to slow transit ; seizures Sexual abuse: No  BOWEL MOVEMENT: Pain with bowel movement: Yes, intermittent 3-4/10 Type of bowel movement:Type (Bristol Stool Scale) Type 2, 3, 4, Frequency every other day, Strain Yes, and Splinting no Fully empty rectum: Yes:   Leakage: No Pads: No Fiber supplement: No  URINATION: Pain with urination: Yes, intermittent 7/10 and located in the lower abdominal area Fully empty bladder: Yes:   Stream: Strong Urgency: Yes: needs to get to the bathroom and leaks urine as she goes to the bathroom Frequency: can be every 30 minutes Leakage: Urge to void, Walking to the bathroom, Laughing, Lifting, and Bending forward; when leaks it is fully emptying her bladder Pads: No  INTERCOURSE: Pain with intercourse:  stopped intercourse due to the pain level  and last time was 10 months  PREGNANCY: Vaginal deliveries 2 Tearing Yes:      OBJECTIVE:  Note: Objective measures were completed at Evaluation unless otherwise noted.  DIAGNOSTIC FINDINGS:  Operative report and pathology were reviewed. Operative findings: Endometriosis stage 3  Pelvic: External genitalia negative for lesions. Vagina negative. Adnexa negative for masses or nodularity. Cervix without gross lesions. Uterus mobile, anteverted, small.   Intraoperative findings revealed a normal upper abdomen including bowel, diaphragmatic surfaces, stomach, and omentum. However, the peritoneum in the pelvis was dotted with fibrotic tissue.  The uterus was small and mobile.  The right and left ovaries were scarred into the ovarian fossae.  Bilateral tubes appeared normal except the endometriosis adhesions to the ovaries.  Appendix wnl  The bowels appeared to have non-inflammed diverticula and endometriosis scarring.  No Hunner's ulcers noted in the bladder after hydrodistension   PATIENT SURVEYS:   PFIQ-7 186 UIQ-7 81 POPIQ-7 100 CRAIQ-7 5  COGNITION: Overall cognitive status: Within functional limits for tasks assessed     SENSATION: Light touch: Appears intact Proprioception: Appears intact     POSTURE: No Significant postural limitations  PELVIC ALIGNMENT:  LUMBARAROM/PROM: lumbar ROM is full    LOWER EXTREMITY ROM: bilateral hip ROM is full   LOWER EXTREMITY MMT: bilateral hip strength 5/5   PALPATION:   General  tenderness located in the abdomen, along the lumbar and gluteals, decreased movement of the lower rib cage                External Perineal Exam tenderness located on the ischiocavernosus, bulbocavernosus, perineal body, levator ani, and obturator internist, and around the anus, Q-tip test with pain at 7, 1, 5, and 6 O'clock;  Internal Pelvic Floor pain with inserting the Q-tip therefore did not assess the pelvic floor internally  Patient confirms identification and approves PT to assess internal pelvic floor and treatment Yes  PELVIC MMT:   MMT eval  Vaginal   Internal Anal Sphincter   External Anal Sphincter   Puborectalis   Diastasis Recti   (Blank rows = not tested)        TONE: increased  PROLAPSE: none  TODAY'S TREATMENT:    04/19/23 Manual: Soft tissue mobilization: Circular massage to abdomen and educated patient on how to perform at home Manual work to the diaphragm and lateral abdominals Manual work to the suprapubic area and educated patient on how to massage the area to reduce the restrictions Myofascial release: Fascial release around the umbilicus Fascial release to the lower abdomen to reduce the restrictions Using a suction cup the the suprapubic area to release the restrictions Tissue rolling of the abdomen  Exercises: Stretches/mobility: Sitting piriformis stretch holding 30 sec bil.  Happy baby holding 1 min in sitting Trunk rotation pulling leg across body holding 30 sec bil.   Earline Glenn pose holding 30 sec Cat cow 15 x Diaphragmatic breathing                                                                                                                              DATE: 04/14/23  EVAL See below    PATIENT EDUCATION: 04/19/23 Education details: Access Code: [email or web address] Person educated: Patient Education method: Explanation, Demonstration, Tactile cues, Verbal cues, and Handouts Education comprehension: verbalized understanding, returned demonstration, verbal cues required, tactile cues required, and needs further education    HOME EXERCISE PROGRAM: 04/19/23 Access Code: [email or web address] URL: [email or web address] Date: 04/19/2023 Prepared by: Marsha Skeen  Exercises - Seated Piriformis Stretch with Trunk Bend  - 1 x daily - 7 x weekly - 1 sets - 1 reps - 30 sec hold - Seated Happy Baby With Trunk Flexion For Pelvic Relaxation  - 1 x daily - 7 x weekly - 1 sets - 1 reps - 1 min hold - Supine Piriformis Stretch with Leg Straight  - 1 x daily - 7 x weekly - 1 sets - 1 reps - 30 sec hold - Diaphragmatic Breathing in Child's Pose with Pelvic Floor Relaxation  - 1 x daily - 7 x weekly - 1 sets - 1 reps - 30  sec hold - Cat Cow  - 1 x daily - 7 x weekly - 1 sets - 10 reps - Supine Diaphragmatic Breathing  - 1 x daily - 7 x weekly - 1 sets - 10 reps - Supine Abdominal Wall Massage  - 1 x daily - 7 x weekly - 3 sets - 10 reps - Abdominal Scar Massage  - 1 x daily - 7 x weekly - 1 sets - 10 reps  ASSESSMENT:  CLINICAL IMPRESSION: Patient is a 43 y.o. female who was seen today for physical therapy treatment for  pelvic and perineal pain, overactive bladder, mixed incontinence and endometriosis.  Patient was able to perform diaphragmatic breathing after the manual work. She had increased mobility of tissue in the abdomen and less firmness. She understands some stretches. Patient would benefit from skilled therapy to reduce her pain to improve her quality of life.    OBJECTIVE IMPAIRMENTS: decreased coordination, decreased endurance, decreased strength, increased fascial restrictions, increased muscle spasms, and pain.   ACTIVITY LIMITATIONS: lifting, bending, continence, toileting, and locomotion level  PARTICIPATION LIMITATIONS: meal prep, cleaning, laundry, interpersonal relationship, driving, shopping, community activity, and occupation  PERSONAL FACTORS: Fitness and 3+ comorbidities: RA TLH and Salpingectomy, LOA and Excision of endometriosis and cystoscopy with hydro distension on 09/23/22 for chronic pelvic pain;  Constipation due to slow transit ; seizures  are also affecting patient's functional outcome.   REHAB POTENTIAL: Excellent  CLINICAL DECISION MAKING: Evolving/moderate complexity  EVALUATION COMPLEXITY: Moderate   GOALS: Goals reviewed with patient? Yes  SHORT TERM GOALS: Target date: 05/12/23  Patient independent with initial HEP for stretches and meditation.  Baseline: not educated yet Goal status: INITIAL  2.  Patient able to perform diaphragmatic breathing to perform a pelvic floor drop.  Baseline: not educated yet Goal status: INITIAL  3.  Patient educated on how to breath, relax the pelvic floor and generate pressure to push the stool out when having a bowel movement.  Baseline: not educated yet Goal status: INITIAL  4.  Patient educated on abdominal massage to work on peristalic motion of the intestines.  Baseline: not educated yet.  Goal status: INITIAL   LONG TERM GOALS: Target date: 10/12/23  Patient independent with advanced HEP for core and pelvic floor.  Baseline: not educated yet Goal status: INITIAL  2.  Patient is able to walk to the bathroom without leaking urine due to the ability to fully relax and contract her pelvic floor to stop the flow of urine.  Baseline: She leaks as she is walking to the bathroom Goal status: INITIAL  3.  Patient is able to wait 1.5 to 2 hours to urinate due to the  improved mobility of the diaphragm, tissue around the bladder and reduction of pain </= 4/10 so her bladder can fully fill.  Baseline: urinates every 30 minutes Goal status: INITIAL  4.  Patient reports she strains with bowel movements </= 50% of the time with pain level </= 2/10 Baseline: always strains and pain is 4/10 Goal status: INITIAL  5.  Patient understands ways to manage her pain with meditation, using a vaginal wand to massage the muscles, ice, heat, stretching.  Baseline: not educated yet Goal status: INITIAL    PLAN:  PT FREQUENCY: 1-2x/week  PT DURATION: 6 months  PLANNED INTERVENTIONS: 97110-Therapeutic exercises, 97530- Therapeutic activity, [id number]- Neuromuscular re-education, 97140- Manual therapy, 97014- Electrical stimulation (unattended), 97035- Ultrasound, Patient/Family education, Taping, Dry Needling, Joint mobilization, Spinal mobilization, Cryotherapy, Moist heat, and Biofeedback  PLAN FOR NEXT SESSION: manual work to the abdomen and diaphragmatic breathing; supine squat to stretch the pelvic floor, trunk rotation    Marsha Skeen, PT 04/19/23 4:58 PM

## 2023-04-26 ENCOUNTER — Ambulatory Visit: Payer: 59 | Admitting: Physical Therapy

## 2023-04-26 ENCOUNTER — Encounter: Payer: Self-pay | Admitting: Physical Therapy

## 2023-04-26 DIAGNOSIS — R102 Pelvic and perineal pain: Secondary | ICD-10-CM

## 2023-04-26 DIAGNOSIS — R1030 Lower abdominal pain, unspecified: Secondary | ICD-10-CM

## 2023-04-26 DIAGNOSIS — R278 Other lack of coordination: Secondary | ICD-10-CM

## 2023-04-26 NOTE — Therapy (Signed)
OUTPATIENT PHYSICAL THERAPY FEMALE PELVIC TREATMENT   Patient Name: Janet Summers MRN: [id number] DOB:1980/04/29, 43 y.o., female Today's Date: 04/26/2023  END OF SESSION:  PT End of Session - 04/26/23 1616     Visit Number 3    Date for PT Re-Evaluation 10/12/23    Authorization Type [employer]    Activity Tolerance Patient tolerated treatment well    Behavior During Therapy [facility] for tasks assessed/performed             Past Medical History:  Diagnosis Date   Anemia    Anxiety    Constipation    Depression    Dyspareunia    Family history of adverse reaction to anesthesia    father wakes up combative   GERD (gastroesophageal reflux disease)    Headache    Onychomycosis    Pneumonia    Seizures (HCC)    Past Surgical History:  Procedure Laterality Date   COLONOSCOPY N/A 12/22/2014   Procedure: COLONOSCOPY;  Surgeon: Wallace Cullens, MD;  Location: [facility] ENDOSCOPY;  Service: Gastroenterology;  Laterality: N/A;   CYSTOSCOPY N/A 09/23/2022   Procedure: CYSTOSCOPY WITH HYDRO DISTENSION;  Surgeon: Christeen Douglas, MD;  Location: [facility] ORS;  Service: Gynecology;  Laterality: N/A;   ESOPHAGOGASTRODUODENOSCOPY (EGD) WITH PROPOFOL N/A 12/22/2014   Procedure: ESOPHAGOGASTRODUODENOSCOPY (EGD) WITH PROPOFOL;  Surgeon: Wallace Cullens, MD;  Location: [facility] ENDOSCOPY;  Service: Gastroenterology;  Laterality: N/A;   IUD REMOVAL N/A 09/23/2022   Procedure: INTRAUTERINE DEVICE (IUD) REMOVAL;  Surgeon: Christeen Douglas, MD;  Location: [facility] ORS;  Service: Gynecology;  Laterality: N/A;   LAPAROSCOPY N/A 09/23/2022   Procedure: LAPAROSCOPY DIAGNOSTIC EXCISION OF ENDOMETRIOSIS;  Surgeon: Christeen Douglas, MD;  Location: [facility] ORS;  Service: Gynecology;  Laterality: N/A;   LEEP     LYSIS OF ADHESION N/A 09/23/2022   Procedure: LYSIS OF ADHESION;  Surgeon: Christeen Douglas, MD;  Location: [facility] ORS;  Service: Gynecology;  Laterality: N/A;   ROBOTIC ASSISTED LAPAROSCOPIC HYSTERECTOMY AND SALPINGECTOMY  Bilateral 09/23/2022   Procedure: XI ROBOTIC ASSISTED LAPAROSCOPIC HYSTERECTOMY AND SALPINGECTOMY;  Surgeon: Christeen Douglas, MD;  Location: [facility] ORS;  Service: Gynecology;  Laterality: Bilateral;   Patient Active Problem List   Diagnosis Date Noted   Seizure disorder (HCC) 02/18/2019   Chronic constipation 07/09/2014   HGSIL (high grade squamous intraepithelial lesion) on Pap smear of cervix 07/09/2014   Postcoital bleeding 07/09/2014    PCP: none  REFERRING PROVIDER: Christeen Douglas, MD   REFERRING DIAG:  R10.2 (ICD-10-CM) - Pelvic and perineal pain  N80.9 (ICD-10-CM) - Endometriosis, unspecified  N39.46 (ICD-10-CM) - Mixed incontinence  N32.81 (ICD-10-CM) - Overactive bladder    THERAPY DIAG:  Other lack of coordination  Pelvic pain  Lower abdominal pain  Rationale for Evaluation and Treatment: Rehabilitation  ONSET DATE: 2016  SUBJECTIVE:  SUBJECTIVE STATEMENT: I feel more relaxed. I was able to get home and make it to the rest room and not urinate on myself. I am trying to not think about the urgency.   PAIN:  Are you having pain? Yes NPRS scale: 3/10 04/26/23 Pain location:  abdomen and hips  Pain type: stabbing, pulling, squeezing, cutting Pain description: constant 2/3 and intermittent is 10/10   Aggravating factors: penile penetration vaginally, lifting heavy items Relieving factors: ice  PRECAUTIONS: None  RED FLAGS: None   WEIGHT BEARING RESTRICTIONS: No  FALLS:  Has patient fallen in last 6 months? Yes. Number of falls tripped over dogs and not due to balance and 1 time due to seizures  LIVING ENVIRONMENT: Lives with: lives with their family   OCCUPATION: sitting job  PLOF: Independent  PATIENT GOALS: reduce the pain, stronger  PERTINENT HISTORY:  RA TLH  and Salpingectomy, LOA and Excision of endometriosis and cystoscopy with hydro distension on 09/23/22 for chronic pelvic pain;  Constipation due to slow transit ; seizures Sexual abuse: No  BOWEL MOVEMENT: Pain with bowel movement: Yes, intermittent 3-4/10 Type of bowel movement:Type (Bristol Stool Scale) Type 2, 3, 4, Frequency every other day, Strain Yes, and Splinting no Fully empty rectum: Yes:   Leakage: No Pads: No Fiber supplement: No  URINATION: Pain with urination: Yes, intermittent 7/10 and located in the lower abdominal area Fully empty bladder: Yes:   Stream: Strong Urgency: Yes: needs to get to the bathroom and leaks urine as she goes to the bathroom Frequency: can be every 30 minutes Leakage: Urge to void, Walking to the bathroom, Laughing, Lifting, and Bending forward; when leaks it is fully emptying her bladder Pads: No  INTERCOURSE: Pain with intercourse:  stopped intercourse due to the pain level  and last time was 10 months  PREGNANCY: Vaginal deliveries 2 Tearing Yes:      OBJECTIVE:  Note: Objective measures were completed at Evaluation unless otherwise noted.  DIAGNOSTIC FINDINGS:  Operative report and pathology were reviewed. Operative findings: Endometriosis stage 3  Pelvic: External genitalia negative for lesions. Vagina negative. Adnexa negative for masses or nodularity. Cervix without gross lesions. Uterus mobile, anteverted, small.   Intraoperative findings revealed a normal upper abdomen including bowel, diaphragmatic surfaces, stomach, and omentum. However, the peritoneum in the pelvis was dotted with fibrotic tissue.  The uterus was small and mobile.  The right and left ovaries were scarred into the ovarian fossae.  Bilateral tubes appeared normal except the endometriosis adhesions to the ovaries.  Appendix wnl  The bowels appeared to have non-inflammed diverticula and endometriosis scarring.  No Hunner's ulcers noted in the bladder after  hydrodistension   PATIENT SURVEYS:  PFIQ-7 186 UIQ-7 81 POPIQ-7 100 CRAIQ-7 5  COGNITION: Overall cognitive status: Within functional limits for tasks assessed     SENSATION: Light touch: Appears intact Proprioception: Appears intact     POSTURE: No Significant postural limitations  PELVIC ALIGNMENT:  LUMBARAROM/PROM: lumbar ROM is full    LOWER EXTREMITY ROM: bilateral hip ROM is full   LOWER EXTREMITY MMT: bilateral hip strength 5/5   PALPATION:   General  tenderness located in the abdomen, along the lumbar and gluteals, decreased movement of the lower rib cage                External Perineal Exam tenderness located on the ischiocavernosus, bulbocavernosus, perineal body, levator ani, and obturator internist, and around the anus, Q-tip test with pain at 7, 1, 5, and  6 O'clock;                              Internal Pelvic Floor pain with inserting the Q-tip therefore did not assess the pelvic floor internally  Patient confirms identification and approves PT to assess internal pelvic floor and treatment Yes  PELVIC MMT:   MMT eval  Vaginal   Internal Anal Sphincter   External Anal Sphincter   Puborectalis   Diastasis Recti   (Blank rows = not tested)        TONE: increased  PROLAPSE: none  TODAY'S TREATMENT:   04/26/23 Manual: Soft tissue mobilization: To assess for dry needling Manual work to the right vastus lateralis, gluteus, around posterior right hip, obturator internist and levator ani  Educated patient on how to massage the pelvic floor muscles through the gluteal fold laying on side Trigger Point Dry Needling  Initial Treatment: Pt instructed on Dry Needling rational, procedures, and possible side effects. Pt instructed to expect mild to moderate muscle soreness later in the day and/or into the next day.  Pt instructed in methods to reduce muscle soreness. Pt instructed to continue prescribed HEP. Because Dry Needling was performed over  or adjacent to a lung field, pt was educated on S/S of pneumothorax and to seek immediate medical attention should they occur.  Patient was educated on signs and symptoms of infection and other risk factors and advised to seek medical attention should they occur.  Patient verbalized understanding of these instructions and education.   Patient Verbal Consent Given: Yes Education Handout Provided: Yes Muscles Treated: right gluteus maximus, gluteus minimus, right vastus lateralis, right piriformis Electrical Stimulation Performed: No Treatment Response/Outcome: elongation of muscle and trigger point response   Exercises: Stretches/mobility: Piriformis stretch with sitting holding 30 sec and trunk movements Z stretch with lifting the bottom foot upward Pigeon pose in prone and reach forward with hip flexor stretch Lunge hip adductor stretch moving foot in and out     04/19/23 Manual: Soft tissue mobilization: Circular massage to abdomen and educated patient on how to perform at home Manual work to the diaphragm and lateral abdominals Manual work to the suprapubic area and educated patient on how to massage the area to reduce the restrictions Myofascial release: Fascial release around the umbilicus Fascial release to the lower abdomen to reduce the restrictions Using a suction cup the the suprapubic area to release the restrictions Tissue rolling of the abdomen  Exercises: Stretches/mobility: Sitting piriformis stretch holding 30 sec bil.  Happy baby holding 1 min in sitting Trunk rotation pulling leg across body holding 30 sec bil.  Marjo Bicker pose holding 30 sec Cat cow 15 x Diaphragmatic breathing       PATIENT EDUCATION: 04/26/23 Education details: Access Code: [email or web address] Person educated: Patient Education method: Explanation, Demonstration, Tactile cues, Verbal cues, and Handouts Education comprehension: verbalized understanding, returned demonstration, verbal cues required,  tactile cues required, and needs further education    HOME EXERCISE PROGRAM: 04/19/23 Access Code: [email or web address] URL: [email or web address] Date: 04/19/2023 Prepared by: Eulis Foster  Exercises - Seated Piriformis Stretch with Trunk Bend  - 1 x daily - 7 x weekly - 1 sets - 1 reps - 30 sec hold - Seated Happy Baby With Trunk Flexion For Pelvic Relaxation  - 1 x daily - 7 x weekly - 1 sets - 1 reps - 1 min hold - Supine Piriformis Stretch with Leg Straight  - 1 x  daily - 7 x weekly - 1 sets - 1 reps - 30 sec hold - Diaphragmatic Breathing in Child's Pose with Pelvic Floor Relaxation  - 1 x daily - 7 x weekly - 1 sets - 1 reps - 30  sec hold - Cat Cow  - 1 x daily - 7 x weekly - 1 sets - 10 reps - Supine Diaphragmatic Breathing  - 1 x daily - 7 x weekly - 1 sets - 10 reps - Supine Abdominal Wall Massage  - 1 x daily - 7 x weekly - 3 sets - 10 reps - Abdominal Scar Massage  - 1 x daily - 7 x weekly - 1 sets - 10 reps  ASSESSMENT:  CLINICAL IMPRESSION: Patient is a 43 y.o. female who was seen today for physical therapy treatment for pelvic and perineal pain, overactive bladder, mixed incontinence and endometriosis.  Patient was able to perform diaphragmatic breathing after the manual work. She had increased mobility of tissue in the abdomen and less firmness. She understands some stretches. Patient responded well to dry needling. Patient reports less hip tightness in the right hip. She had trigger points in the right and left pelvic floor. Patient would benefit from skilled therapy to reduce her pain to improve her quality of life.   OBJECTIVE IMPAIRMENTS: decreased coordination, decreased endurance, decreased strength, increased fascial restrictions, increased muscle spasms, and pain.   ACTIVITY LIMITATIONS: lifting, bending, continence, toileting, and locomotion level  PARTICIPATION LIMITATIONS: meal prep, cleaning, laundry, interpersonal relationship, driving, shopping, community  activity, and occupation  PERSONAL FACTORS: Fitness and 3+ comorbidities: RA TLH and Salpingectomy, LOA and Excision of endometriosis and cystoscopy with hydro distension on 09/23/22 for chronic pelvic pain;  Constipation due to slow transit ; seizures  are also affecting patient's functional outcome.   REHAB POTENTIAL: Excellent  CLINICAL DECISION MAKING: Evolving/moderate complexity  EVALUATION COMPLEXITY: Moderate   GOALS: Goals reviewed with patient? Yes  SHORT TERM GOALS: Target date: 05/12/23  Patient independent with initial HEP for stretches and meditation.  Baseline: not educated yet Goal status: Met 04/26/23  2.  Patient able to perform diaphragmatic breathing to perform a pelvic floor drop.  Baseline: not educated yet Goal status: INITIAL  3.  Patient educated on how to breath, relax the pelvic floor and generate pressure to push the stool out when having a bowel movement.  Baseline: not educated yet Goal status: INITIAL  4.  Patient educated on abdominal massage to work on peristalic motion of the intestines.  Baseline: not educated yet.  Goal status: INITIAL   LONG TERM GOALS: Target date: 10/12/23  Patient independent with advanced HEP for core and pelvic floor.  Baseline: not educated yet Goal status: INITIAL  2.  Patient is able to walk to the bathroom without leaking urine due to the ability to fully relax and contract her pelvic floor to stop the flow of urine.  Baseline: She leaks as she is walking to the bathroom Goal status: INITIAL  3.  Patient is able to wait 1.5 to 2 hours to urinate due to the improved mobility of the diaphragm, tissue around the bladder and reduction of pain </= 4/10 so her bladder can fully fill.  Baseline: urinates every 30 minutes Goal status: INITIAL  4.  Patient reports she strains with bowel movements </= 50% of the time with pain level </= 2/10 Baseline: always strains and pain is 4/10 Goal status: INITIAL  5.  Patient  understands ways to manage her pain  with meditation, using a vaginal wand to massage the muscles, ice, heat, stretching.  Baseline: not educated yet Goal status: INITIAL    PLAN:  PT FREQUENCY: 1-2x/week  PT DURATION: 6 months  PLANNED INTERVENTIONS: 97110-Therapeutic exercises, 97530- Therapeutic activity, [id number]- Neuromuscular re-education, 97140- Manual therapy, 97014- Electrical stimulation (unattended), 97035- Ultrasound, Patient/Family education, Taping, Dry Needling, Joint mobilization, Spinal mobilization, Cryotherapy, Moist heat, and Biofeedback  PLAN FOR NEXT SESSION: manual work to the abdomen and diaphragmatic breathing; supine squat to stretch the pelvic floor, trunk rotation , see how dry needling went, review abdominal massage   Eulis Foster, PT 04/26/23 5:01 PM

## 2023-04-26 NOTE — Patient Instructions (Signed)
 Trigger Point Dry Needling  What is Trigger Point Dry Needling (DN)? DN is a physical therapy technique used to treat muscle pain and dysfunction. Specifically, DN helps deactivate muscle trigger points (muscle knots).  A thin filiform needle is used to penetrate the skin and stimulate the underlying trigger point. The goal is for a local twitch response (LTR) to occur and for the trigger point to relax. No medication of any kind is injected during the procedure.   What Does Trigger Point Dry Needling Feel Like?  The procedure feels different for each individual patient. Some patients report that they do not actually feel the needle enter the skin and overall the process is not painful. Very mild bleeding may occur. However, many patients feel a deep cramping in the muscle in which the needle was inserted. This is the local twitch response.   How Will I feel after the treatment? Soreness is normal, and the onset of soreness may not occur for a few hours. Typically this soreness does not last longer than two days.  Bruising is uncommon, however; ice can be used to decrease any possible bruising.  In rare cases feeling tired or nauseous after the treatment is normal. In addition, your symptoms may get worse before they get better, this period will typically not last longer than 24 hours.   What Can I do After My Treatment? Increase your hydration by drinking more water for the next 24 hours.  You may place ice or heat on the areas treated that have become sore, however, do not use heat on inflamed or bruised areas. Heat often brings more relief post needling. You can continue your regular activities, but vigorous activity is not recommended initially after the treatment for 24 hours. DN is best combined with other physical therapy such as strengthening, stretching, and other therapies.   What are the complications? While your therapist has had extensive training in minimizing the risks of trigger  point dry needling, it is important to understand the risks of any procedure.  Risks include bleeding, pain, fatigue, hematoma, infection, vertigo, nausea or nerve involvement. Monitor for any changes to your skin or sensation. Contact your therapist or MD with concerns.  A rare but serious complication is a pneumothorax over or near your middle and upper chest and back If you have dry needling in this area, monitor for the following symptoms: Shortness of breath on exertion and/or Difficulty taking a deep breath and/or Chest Pain and/or A dry cough If any of the above symptoms develop, please go to the nearest emergency room or call 911. Tell them you had dry needling over your thorax and report any symptoms you are having. Please follow-up with your treating therapist after you complete the medical evaluation.   [facility]  [address] [place] [address].  [phone]

## 2023-05-03 ENCOUNTER — Ambulatory Visit: Payer: 59 | Admitting: Physical Therapy

## 2023-05-03 ENCOUNTER — Encounter: Payer: Self-pay | Admitting: Physical Therapy

## 2023-05-03 DIAGNOSIS — R102 Pelvic and perineal pain: Secondary | ICD-10-CM

## 2023-05-03 DIAGNOSIS — R278 Other lack of coordination: Secondary | ICD-10-CM

## 2023-05-03 DIAGNOSIS — R1030 Lower abdominal pain, unspecified: Secondary | ICD-10-CM

## 2023-05-03 NOTE — Therapy (Signed)
OUTPATIENT PHYSICAL THERAPY FEMALE PELVIC TREATMENT   Patient Name: Janet Summers MRN: [id number] DOB:17-Mar-1981, 43 y.o., female Today's Date: 05/03/2023  END OF SESSION:  PT End of Session - 05/03/23 1620     Visit Number 4    Date for PT Re-Evaluation 10/12/23    Authorization Type [employer]    Activity Tolerance Patient tolerated treatment well    Behavior During Therapy [facility] for tasks assessed/performed             Past Medical History:  Diagnosis Date   Anemia    Anxiety    Constipation    Depression    Dyspareunia    Family history of adverse reaction to anesthesia    father wakes up combative   GERD (gastroesophageal reflux disease)    Headache    Onychomycosis    Pneumonia    Seizures (HCC)    Past Surgical History:  Procedure Laterality Date   COLONOSCOPY N/A 12/22/2014   Procedure: COLONOSCOPY;  Surgeon: Wallace Cullens, MD;  Location: [facility] ENDOSCOPY;  Service: Gastroenterology;  Laterality: N/A;   CYSTOSCOPY N/A 09/23/2022   Procedure: CYSTOSCOPY WITH HYDRO DISTENSION;  Surgeon: Christeen Douglas, MD;  Location: [facility] ORS;  Service: Gynecology;  Laterality: N/A;   ESOPHAGOGASTRODUODENOSCOPY (EGD) WITH PROPOFOL N/A 12/22/2014   Procedure: ESOPHAGOGASTRODUODENOSCOPY (EGD) WITH PROPOFOL;  Surgeon: Wallace Cullens, MD;  Location: [facility] ENDOSCOPY;  Service: Gastroenterology;  Laterality: N/A;   IUD REMOVAL N/A 09/23/2022   Procedure: INTRAUTERINE DEVICE (IUD) REMOVAL;  Surgeon: Christeen Douglas, MD;  Location: [facility] ORS;  Service: Gynecology;  Laterality: N/A;   LAPAROSCOPY N/A 09/23/2022   Procedure: LAPAROSCOPY DIAGNOSTIC EXCISION OF ENDOMETRIOSIS;  Surgeon: Christeen Douglas, MD;  Location: [facility] ORS;  Service: Gynecology;  Laterality: N/A;   LEEP     LYSIS OF ADHESION N/A 09/23/2022   Procedure: LYSIS OF ADHESION;  Surgeon: Christeen Douglas, MD;  Location: [facility] ORS;  Service: Gynecology;  Laterality: N/A;   ROBOTIC ASSISTED LAPAROSCOPIC HYSTERECTOMY AND SALPINGECTOMY  Bilateral 09/23/2022   Procedure: XI ROBOTIC ASSISTED LAPAROSCOPIC HYSTERECTOMY AND SALPINGECTOMY;  Surgeon: Christeen Douglas, MD;  Location: [facility] ORS;  Service: Gynecology;  Laterality: Bilateral;   Patient Active Problem List   Diagnosis Date Noted   Seizure disorder (HCC) 02/18/2019   Chronic constipation 07/09/2014   HGSIL (high grade squamous intraepithelial lesion) on Pap smear of cervix 07/09/2014   Postcoital bleeding 07/09/2014    PCP: none  REFERRING PROVIDER: Christeen Douglas, MD   REFERRING DIAG:  R10.2 (ICD-10-CM) - Pelvic and perineal pain  N80.9 (ICD-10-CM) - Endometriosis, unspecified  N39.46 (ICD-10-CM) - Mixed incontinence  N32.81 (ICD-10-CM) - Overactive bladder    THERAPY DIAG:  Other lack of coordination  Pelvic pain  Lower abdominal pain  Rationale for Evaluation and Treatment: Rehabilitation  ONSET DATE: 2016  SUBJECTIVE:  SUBJECTIVE STATEMENT: Patient reports that she is doing well today. She has been doing her stretches at home, however, her pain is still present in left upper quadrant of the abdomen, feeling strong today. Patient has had busy day at work involving a large amount of squatting and she is experiencing an achy left hip from this.   PAIN:  Are you having pain? Yes NPRS scale: 3/10 04/26/23 Pain location:  abdomen and hips  Pain type: stabbing, pulling, squeezing, cutting Pain description: constant 2/3 and intermittent is 10/10   Aggravating factors: penile penetration vaginally, lifting heavy items Relieving factors: ice  PRECAUTIONS: None  RED FLAGS: None   WEIGHT BEARING RESTRICTIONS: No  FALLS:  Has patient fallen in last 6 months? Yes. Number of falls tripped over dogs and not due to balance and 1 time due to seizures  LIVING  ENVIRONMENT: Lives with: lives with their family   OCCUPATION: sitting job  PLOF: Independent  PATIENT GOALS: reduce the pain, stronger  PERTINENT HISTORY:  RA TLH and Salpingectomy, LOA and Excision of endometriosis and cystoscopy with hydro distension on 09/23/22 for chronic pelvic pain;  Constipation due to slow transit ; seizures Sexual abuse: No  BOWEL MOVEMENT: Pain with bowel movement: Yes, intermittent 3-4/10 Type of bowel movement:Type (Bristol Stool Scale) Type 2, 3, 4, Frequency every other day, Strain Yes, and Splinting no Fully empty rectum: Yes:   Leakage: No Pads: No Fiber supplement: No  URINATION: Pain with urination: Yes, intermittent 7/10 and located in the lower abdominal area Fully empty bladder: Yes:   Stream: Strong Urgency: Yes: needs to get to the bathroom and leaks urine as she goes to the bathroom Frequency: can be every 30 minutes Leakage: Urge to void, Walking to the bathroom, Laughing, Lifting, and Bending forward; when leaks it is fully emptying her bladder Pads: No  INTERCOURSE: Pain with intercourse:  stopped intercourse due to the pain level  and last time was 10 months  PREGNANCY: Vaginal deliveries 2 Tearing Yes:      OBJECTIVE:  Note: Objective measures were completed at Evaluation unless otherwise noted.  DIAGNOSTIC FINDINGS:  Operative report and pathology were reviewed. Operative findings: Endometriosis stage 3  Pelvic: External genitalia negative for lesions. Vagina negative. Adnexa negative for masses or nodularity. Cervix without gross lesions. Uterus mobile, anteverted, small.   Intraoperative findings revealed a normal upper abdomen including bowel, diaphragmatic surfaces, stomach, and omentum. However, the peritoneum in the pelvis was dotted with fibrotic tissue.  The uterus was small and mobile.  The right and left ovaries were scarred into the ovarian fossae.  Bilateral tubes appeared normal except the endometriosis  adhesions to the ovaries.  Appendix wnl  The bowels appeared to have non-inflammed diverticula and endometriosis scarring.  No Hunner's ulcers noted in the bladder after hydrodistension   PATIENT SURVEYS:  PFIQ-7 186 UIQ-7 81 POPIQ-7 100 CRAIQ-7 5  COGNITION: Overall cognitive status: Within functional limits for tasks assessed     SENSATION: Light touch: Appears intact Proprioception: Appears intact     POSTURE: No Significant postural limitations  PELVIC ALIGNMENT:  LUMBARAROM/PROM: lumbar ROM is full    LOWER EXTREMITY ROM: bilateral hip ROM is full   LOWER EXTREMITY MMT: bilateral hip strength 5/5   PALPATION:   General  tenderness located in the abdomen, along the lumbar and gluteals, decreased movement of the lower rib cage                External Perineal Exam tenderness located  on the ischiocavernosus, bulbocavernosus, perineal body, levator ani, and obturator internist, and around the anus, Q-tip test with pain at 7, 1, 5, and 6 O'clock;                              Internal Pelvic Floor pain with inserting the Q-tip therefore did not assess the pelvic floor internally  Patient confirms identification and approves PT to assess internal pelvic floor and treatment Yes  PELVIC MMT:   MMT eval  Vaginal   Internal Anal Sphincter   External Anal Sphincter   Puborectalis   Diastasis Recti   (Blank rows = not tested)        TONE: increased  PROLAPSE: none  TODAY'S TREATMENT:   05/03/23 Manual: Soft tissue mobilization: to decrease pelvic floor muscle tension externally  Adductor group left  Ischiocavernosus  Obturator internus  Myofascial release: Manual release of th obturator internist on the left hand side Internal pelvic floor techniques: No emotional/communication barriers or cognitive limitation. Patient is motivated to learn. Patient understands and agrees with treatment goals and plan. PT explains patient will be examined in standing,  sitting, and lying down to see how their muscles and joints work. When they are ready, they will be asked to remove their underwear so PT can examine their perineum. The patient is also given the option of providing their own chaperone as one is not provided in our facility. The patient also has the right and is explained the right to defer or refuse any part of the evaluation or treatment including the internal exam. With the patient's consent, PT will use one gloved finger to gently assess the muscles of the pelvic floor, seeing how well it contracts and relaxes and if there is muscle symmetry. After, the patient will get dressed and PT and patient will discuss exam findings and plan of care. PT and patient discuss plan of care, schedule, attendance policy and HEP activities.  Left obturator internus trigger point release with external rotation passive range of motion of the hip Therapist finger in the vaginal canal working on the left iliococcygeus, left obturator internist, puborectalis Fascial release of the left anterior wall by the left ovary to release the restrictions Therapeutic activities: Functional strengthening activities: Discussed with patient her ergonomics in the office to reduce her trunk rotation while reaching for the printer to reduce strain on the pelvic floor and left hip    04/26/23 Manual: Soft tissue mobilization: To assess for dry needling Manual work to the right vastus lateralis, gluteus, around posterior right hip, obturator internist and levator ani  Educated patient on how to massage the pelvic floor muscles through the gluteal fold laying on side Trigger Point Dry Needling  Initial Treatment: Pt instructed on Dry Needling rational, procedures, and possible side effects. Pt instructed to expect mild to moderate muscle soreness later in the day and/or into the next day.  Pt instructed in methods to reduce muscle soreness. Pt instructed to continue prescribed  HEP. Because Dry Needling was performed over or adjacent to a lung field, pt was educated on S/S of pneumothorax and to seek immediate medical attention should they occur.  Patient was educated on signs and symptoms of infection and other risk factors and advised to seek medical attention should they occur.  Patient verbalized understanding of these instructions and education.  Patient Verbal Consent Given: Yes Education Handout Provided: Yes Muscles Treated: right gluteus maximus, gluteus minimus,  right vastus lateralis, right piriformis Electrical Stimulation Performed: No Treatment Response/Outcome: elongation of muscle and trigger point response Exercises: Stretches/mobility: Piriformis stretch with sitting holding 30 sec and trunk movements Z stretch with lifting the bottom foot upward Pigeon pose in prone and reach forward with hip flexor stretch Lunge hip adductor stretch moving foot in and out   04/19/23 Manual: Soft tissue mobilization: Circular massage to abdomen and educated patient on how to perform at home Manual work to the diaphragm and lateral abdominals Manual work to the suprapubic area and educated patient on how to massage the area to reduce the restrictions Myofascial release: Fascial release around the umbilicus Fascial release to the lower abdomen to reduce the restrictions Using a suction cup the the suprapubic area to release the restrictions Tissue rolling of the abdomen  Exercises: Stretches/mobility: Sitting piriformis stretch holding 30 sec bil.  Happy baby holding 1 min in sitting Trunk rotation pulling leg across body holding 30 sec bil.  Marjo Bicker pose holding 30 sec Cat cow 15 x Diaphragmatic breathing       PATIENT EDUCATION: 04/26/23 Education details: Access Code: [email or web address] Person educated: Patient Education method: Explanation, Demonstration, Tactile cues, Verbal cues, and Handouts Education comprehension: verbalized understanding, returned  demonstration, verbal cues required, tactile cues required, and needs further education    HOME EXERCISE PROGRAM: 04/19/23 Access Code: [email or web address] URL: [email or web address] Date: 04/19/2023 Prepared by: Eulis Foster  Exercises - Seated Piriformis Stretch with Trunk Bend  - 1 x daily - 7 x weekly - 1 sets - 1 reps - 30 sec hold - Seated Happy Baby With Trunk Flexion For Pelvic Relaxation  - 1 x daily - 7 x weekly - 1 sets - 1 reps - 1 min hold - Supine Piriformis Stretch with Leg Straight  - 1 x daily - 7 x weekly - 1 sets - 1 reps - 30 sec hold - Diaphragmatic Breathing in Child's Pose with Pelvic Floor Relaxation  - 1 x daily - 7 x weekly - 1 sets - 1 reps - 30  sec hold - Cat Cow  - 1 x daily - 7 x weekly - 1 sets - 10 reps - Supine Diaphragmatic Breathing  - 1 x daily - 7 x weekly - 1 sets - 10 reps - Supine Abdominal Wall Massage  - 1 x daily - 7 x weekly - 3 sets - 10 reps - Abdominal Scar Massage  - 1 x daily - 7 x weekly - 1 sets - 10 reps  ASSESSMENT:  CLINICAL IMPRESSION: Patient is a 43 y.o. female who was seen today for physical therapy treatment for pelvic and perineal pain, overactive bladder, mixed incontinence and endometriosis. She is still experiencing left upper quadrant abdominal pain (10/10) and today her left hip was sore from sitting and transferring at work. Patient educated to adjust ergonomic setup at work to decrease repetitive rotation through the hips. With today's pelvic floor manual interventions, patient could feel referred pain from superficial pelvic floor palpation (ischiocavernosus and obturator internus) in the left lower quadrant of the abdomen. With deep pelvic floor muscle palpation, patient could feel referred left sided lower abdominal pain that decreased with diaphragmatic breathing interventions. Patient was able to actively lengthen her pelvic floor with inhalation very well with internal palpation. Patient's left obturator internus  responded well to palpation with passive external rotation of the hip. Following manual pelvic floor interventions, patient reports decreased pain in left hip and left side of abdomen (5/10). Patient would benefit  from skilled therapy to reduce her pain to improve her quality of life.   OBJECTIVE IMPAIRMENTS: decreased coordination, decreased endurance, decreased strength, increased fascial restrictions, increased muscle spasms, and pain.   ACTIVITY LIMITATIONS: lifting, bending, continence, toileting, and locomotion level  PARTICIPATION LIMITATIONS: meal prep, cleaning, laundry, interpersonal relationship, driving, shopping, community activity, and occupation  PERSONAL FACTORS: Fitness and 3+ comorbidities: RA TLH and Salpingectomy, LOA and Excision of endometriosis and cystoscopy with hydro distension on 09/23/22 for chronic pelvic pain;  Constipation due to slow transit ; seizures  are also affecting patient's functional outcome.   REHAB POTENTIAL: Excellent  CLINICAL DECISION MAKING: Evolving/moderate complexity  EVALUATION COMPLEXITY: Moderate   GOALS: Goals reviewed with patient? Yes  SHORT TERM GOALS: Target date: 05/12/23  Patient independent with initial HEP for stretches and meditation.  Baseline: not educated yet Goal status: Met 04/26/23  2.  Patient able to perform diaphragmatic breathing to perform a pelvic floor drop.  Baseline: not educated yet Goal status: INITIAL  3.  Patient educated on how to breath, relax the pelvic floor and generate pressure to push the stool out when having a bowel movement.  Baseline: not educated yet Goal status: INITIAL  4.  Patient educated on abdominal massage to work on peristalic motion of the intestines.  Baseline: not educated yet.  Goal status: INITIAL   LONG TERM GOALS: Target date: 10/12/23  Patient independent with advanced HEP for core and pelvic floor.  Baseline: not educated yet Goal status: INITIAL  2.  Patient is able  to walk to the bathroom without leaking urine due to the ability to fully relax and contract her pelvic floor to stop the flow of urine.  Baseline: She leaks as she is walking to the bathroom Goal status: INITIAL  3.  Patient is able to wait 1.5 to 2 hours to urinate due to the improved mobility of the diaphragm, tissue around the bladder and reduction of pain </= 4/10 so her bladder can fully fill.  Baseline: urinates every 30 minutes Goal status: INITIAL  4.  Patient reports she strains with bowel movements </= 50% of the time with pain level </= 2/10 Baseline: always strains and pain is 4/10 Goal status: INITIAL  5.  Patient understands ways to manage her pain with meditation, using a vaginal wand to massage the muscles, ice, heat, stretching.  Baseline: not educated yet Goal status: INITIAL    PLAN:  PT FREQUENCY: 1-2x/week  PT DURATION: 6 months  PLANNED INTERVENTIONS: 97110-Therapeutic exercises, 97530- Therapeutic activity, [id number]- Neuromuscular re-education, 97140- Manual therapy, 97014- Electrical stimulation (unattended), 97035- Ultrasound, Patient/Family education, Taping, Dry Needling, Joint mobilization, Spinal mobilization, Cryotherapy, Moist heat, and Biofeedback  PLAN FOR NEXT SESSION: manual work to the left pelvic floor and right; SI joint opening with hip IR  Eulis Foster, PT 05/03/23 4:58 PM

## 2023-05-04 ENCOUNTER — Ambulatory Visit: Payer: 59 | Admitting: Podiatry

## 2023-05-10 ENCOUNTER — Ambulatory Visit: Payer: 59 | Attending: Obstetrics and Gynecology | Admitting: Physical Therapy

## 2023-05-10 ENCOUNTER — Encounter: Payer: Self-pay | Admitting: Physical Therapy

## 2023-05-10 DIAGNOSIS — R1030 Lower abdominal pain, unspecified: Secondary | ICD-10-CM | POA: Insufficient documentation

## 2023-05-10 DIAGNOSIS — R102 Pelvic and perineal pain: Secondary | ICD-10-CM | POA: Insufficient documentation

## 2023-05-10 DIAGNOSIS — R278 Other lack of coordination: Secondary | ICD-10-CM | POA: Insufficient documentation

## 2023-05-10 NOTE — Therapy (Signed)
 OUTPATIENT PHYSICAL THERAPY FEMALE PELVIC TREATMENT   Patient Name: Janet Summers MRN: [id number] DOB:[birth year], 43 y.o., female Today's Date: 05/10/2023  END OF SESSION:  PT End of Session - 05/10/23 1616     Visit Number 5    Date for PT Re-Evaluation 10/12/23    Authorization Type [employer]    Authorization Time Period 04/14/23-06/12/23    Authorization - Visit Number 4    Authorization - Number of Visits 8    PT Start Time 1615    PT Stop Time 1655    PT Time Calculation (min) 40 min    Activity Tolerance Patient tolerated treatment well    Behavior During Therapy WFL for tasks assessed/performed             Past Medical History:  Diagnosis Date   Anemia    Anxiety    Constipation    Depression    Dyspareunia    Family history of adverse reaction to anesthesia    father wakes up combative   GERD (gastroesophageal reflux disease)    Headache    Onychomycosis    Pneumonia    Seizures (HCC)    Past Surgical History:  Procedure Laterality Date   COLONOSCOPY N/A 12/22/2014   Procedure: COLONOSCOPY;  Surgeon: Deward CINDERELLA Piedmont, MD;  Location: [facility] ENDOSCOPY;  Service: Gastroenterology;  Laterality: N/A;   CYSTOSCOPY N/A 09/23/2022   Procedure: CYSTOSCOPY WITH HYDRO DISTENSION;  Surgeon: Verdon Keen, MD;  Location: [facility] ORS;  Service: Gynecology;  Laterality: N/A;   ESOPHAGOGASTRODUODENOSCOPY (EGD) WITH PROPOFOL  N/A 12/22/2014   Procedure: ESOPHAGOGASTRODUODENOSCOPY (EGD) WITH PROPOFOL ;  Surgeon: Deward CINDERELLA Piedmont, MD;  Location: [facility] ENDOSCOPY;  Service: Gastroenterology;  Laterality: N/A;   IUD REMOVAL N/A 09/23/2022   Procedure: INTRAUTERINE DEVICE (IUD) REMOVAL;  Surgeon: Verdon Keen, MD;  Location: [facility] ORS;  Service: Gynecology;  Laterality: N/A;   LAPAROSCOPY N/A 09/23/2022   Procedure: LAPAROSCOPY DIAGNOSTIC EXCISION OF ENDOMETRIOSIS;  Surgeon: Verdon Keen, MD;  Location: [facility] ORS;  Service: Gynecology;  Laterality: N/A;   LEEP     LYSIS OF ADHESION N/A  09/23/2022   Procedure: LYSIS OF ADHESION;  Surgeon: Verdon Keen, MD;  Location: [facility] ORS;  Service: Gynecology;  Laterality: N/A;   ROBOTIC ASSISTED LAPAROSCOPIC HYSTERECTOMY AND SALPINGECTOMY Bilateral 09/23/2022   Procedure: XI ROBOTIC ASSISTED LAPAROSCOPIC HYSTERECTOMY AND SALPINGECTOMY;  Surgeon: Verdon Keen, MD;  Location: [facility] ORS;  Service: Gynecology;  Laterality: Bilateral;   Patient Active Problem List   Diagnosis Date Noted   Seizure disorder (HCC) 02/18/2019   Chronic constipation 07/09/2014   HGSIL (high grade squamous intraepithelial lesion) on Pap smear of cervix 07/09/2014   Postcoital bleeding 07/09/2014    PCP: none  REFERRING PROVIDER: Verdon Keen, MD   REFERRING DIAG:  R10.2 (ICD-10-CM) - Pelvic and perineal pain  N80.9 (ICD-10-CM) - Endometriosis, unspecified  N39.46 (ICD-10-CM) - Mixed incontinence  N32.81 (ICD-10-CM) - Overactive bladder    THERAPY DIAG:  Other lack of coordination  Pelvic pain  Lower abdominal pain  Rationale for Evaluation and Treatment: Rehabilitation  ONSET DATE: 2016  SUBJECTIVE:  SUBJECTIVE STATEMENT: I felt better for a little bit after last visit.   PAIN:  Are you having pain? Yes NPRS scale: 3/10 04/26/23 Pain location:  abdomen and hips  Pain type: stabbing, pulling, squeezing, cutting Pain description: constant 2/3 and intermittent is 10/10   Aggravating factors: penile penetration vaginally, lifting heavy items Relieving factors: ice  PRECAUTIONS: None  RED FLAGS: None   WEIGHT BEARING RESTRICTIONS: No  FALLS:  Has patient fallen in last 6 months? Yes. Number of falls tripped over dogs and not due to balance and 1 time due to seizures  LIVING ENVIRONMENT: Lives with: lives with their family   OCCUPATION:  sitting job  PLOF: Independent  PATIENT GOALS: reduce the pain, stronger  PERTINENT HISTORY:  RA TLH and Salpingectomy, LOA and Excision of endometriosis and cystoscopy with hydro distension on 09/23/22 for chronic pelvic pain;  Constipation due to slow transit ; seizures Sexual abuse: No  BOWEL MOVEMENT: Pain with bowel movement: Yes, intermittent 3-4/10 Type of bowel movement:Type (Bristol Stool Scale) Type 2, 3, 4, Frequency every other day, Strain Yes, and Splinting no Fully empty rectum: Yes:   Leakage: No Pads: No Fiber supplement: No  URINATION: Pain with urination: Yes, intermittent 7/10 and located in the lower abdominal area Fully empty bladder: Yes:   Stream: Strong Urgency: Yes: needs to get to the bathroom and leaks urine as she goes to the bathroom Frequency: can be every 30 minutes Leakage: Urge to void, Walking to the bathroom, Laughing, Lifting, and Bending forward; when leaks it is fully emptying her bladder Pads: No  INTERCOURSE: Pain with intercourse:  stopped intercourse due to the pain level  and last time was 10 months  PREGNANCY: Vaginal deliveries 2 Tearing Yes:      OBJECTIVE:  Note: Objective measures were completed at Evaluation unless otherwise noted.  DIAGNOSTIC FINDINGS:  Operative report and pathology were reviewed. Operative findings: Endometriosis stage 3  Pelvic: External genitalia negative for lesions. Vagina negative. Adnexa negative for masses or nodularity. Cervix without gross lesions. Uterus mobile, anteverted, small.   Intraoperative findings revealed a normal upper abdomen including bowel, diaphragmatic surfaces, stomach, and omentum. However, the peritoneum in the pelvis was dotted with fibrotic tissue.  The uterus was small and mobile.  The right and left ovaries were scarred into the ovarian fossae.  Bilateral tubes appeared normal except the endometriosis adhesions to the ovaries.  Appendix wnl  The bowels appeared to  have non-inflammed diverticula and endometriosis scarring.  No Hunner's ulcers noted in the bladder after hydrodistension   PATIENT SURVEYS:  PFIQ-7 186 UIQ-7 81 POPIQ-7 100 CRAIQ-7 5  COGNITION: Overall cognitive status: Within functional limits for tasks assessed     SENSATION: Light touch: Appears intact Proprioception: Appears intact     POSTURE: No Significant postural limitations  PELVIC ALIGNMENT:  LUMBARAROM/PROM: lumbar ROM is full    LOWER EXTREMITY ROM: bilateral hip ROM is full   LOWER EXTREMITY MMT: bilateral hip strength 5/5   PALPATION:   General  tenderness located in the abdomen, along the lumbar and gluteals, decreased movement of the lower rib cage                External Perineal Exam tenderness located on the ischiocavernosus, bulbocavernosus, perineal body, levator ani, and obturator internist, and around the anus, Q-tip test with pain at 7, 1, 5, and 6 O'clock;  Internal Pelvic Floor pain with inserting the Q-tip therefore did not assess the pelvic floor internally  Patient confirms identification and approves PT to assess internal pelvic floor and treatment Yes  PELVIC MMT:   MMT 05/10/23  Vaginal 3/5  Internal Anal Sphincter   External Anal Sphincter   Puborectalis   Diastasis Recti   (Blank rows = not tested)        TONE: increased  PROLAPSE: none  TODAY'S TREATMENT:   05/10/23 Manual: Soft tissue mobilization: To assess for dry needling,  Manual work to the perineal body and ischiocavernosus externally to elongate after the dry needling.  Internal pelvic floor techniques: No emotional/communication barriers or cognitive limitation. Patient is motivated to learn. Patient understands and agrees with treatment goals and plan. PT explains patient will be examined in standing, sitting, and lying down to see how their muscles and joints work. When they are ready, they will be asked to remove their  underwear so PT can examine their perineum. The patient is also given the option of providing their own chaperone as one is not provided in our facility. The patient also has the right and is explained the right to defer or refuse any part of the evaluation or treatment including the internal exam. With the patient's consent, PT will use one gloved finger to gently assess the muscles of the pelvic floor, seeing how well it contracts and relaxes and if there is muscle symmetry. After, the patient will get dressed and PT and patient will discuss exam findings and plan of care. PT and patient discuss plan of care, schedule, attendance policy and HEP activities.  Going through the vaginal canal to perform manual work to the perineal body, left ischiocavernosus, obturator internist, coccygeus and ATLA ligament with left hip movement, trunk movement, and pelvic tilts  Manual work to the area where the left ovary is internally and externally with trunk rotation.  Trigger Point Dry Needling  Subsequent Treatment: Instructions provided previously at initial dry needling treatment.  Instructions reviewed, if requested by the patient, prior to subsequent dry needling treatment.   Patient Verbal Consent Given: Yes Education Handout Provided: Previously Provided Muscles Treated: left perineal body, coccygeus, ischiocavernosus, obturator internist Electrical Stimulation Performed: No Treatment Response/Outcome: elongation of muscle and trigger point response Exercises: Stretches/mobility: Lay prone on small ball on the lower quadrant of the abdomen to massage the tender points Quadruped with hip internal rotation rocking back and forth to stretch rotators Half kneel hip flexor stretch with right trunk sidebend    05/03/23 Manual: Soft tissue mobilization: to decrease pelvic floor muscle tension externally  Adductor group left  Ischiocavernosus  Obturator internus  Myofascial release: Manual release of th  obturator internist on the left hand side Internal pelvic floor techniques: No emotional/communication barriers or cognitive limitation. Patient is motivated to learn. Patient understands and agrees with treatment goals and plan. PT explains patient will be examined in standing, sitting, and lying down to see how their muscles and joints work. When they are ready, they will be asked to remove their underwear so PT can examine their perineum. The patient is also given the option of providing their own chaperone as one is not provided in our facility. The patient also has the right and is explained the right to defer or refuse any part of the evaluation or treatment including the internal exam. With the patient's consent, PT will use one gloved finger to gently assess the muscles of the pelvic floor, seeing how  well it contracts and relaxes and if there is muscle symmetry. After, the patient will get dressed and PT and patient will discuss exam findings and plan of care. PT and patient discuss plan of care, schedule, attendance policy and HEP activities.  Left obturator internus trigger point release with external rotation passive range of motion of the hip Therapist finger in the vaginal canal working on the left iliococcygeus, left obturator internist, puborectalis Fascial release of the left anterior wall by the left ovary to release the restrictions Therapeutic activities: Functional strengthening activities: Discussed with patient her ergonomics in the office to reduce her trunk rotation while reaching for the printer to reduce strain on the pelvic floor and left hip    04/26/23 Manual: Soft tissue mobilization: To assess for dry needling Manual work to the right vastus lateralis, gluteus, around posterior right hip, obturator internist and levator ani  Educated patient on how to massage the pelvic floor muscles through the gluteal fold laying on side Trigger Point Dry Needling  Initial  Treatment: Pt instructed on Dry Needling rational, procedures, and possible side effects. Pt instructed to expect mild to moderate muscle soreness later in the day and/or into the next day.  Pt instructed in methods to reduce muscle soreness. Pt instructed to continue prescribed HEP. Because Dry Needling was performed over or adjacent to a lung field, pt was educated on S/S of pneumothorax and to seek immediate medical attention should they occur.  Patient was educated on signs and symptoms of infection and other risk factors and advised to seek medical attention should they occur.  Patient verbalized understanding of these instructions and education.  Patient Verbal Consent Given: Yes Education Handout Provided: Yes Muscles Treated: right gluteus maximus, gluteus minimus, right vastus lateralis, right piriformis Electrical Stimulation Performed: No Treatment Response/Outcome: elongation of muscle and trigger point response Exercises: Stretches/mobility: Piriformis stretch with sitting holding 30 sec and trunk movements Z stretch with lifting the bottom foot upward Pigeon pose in prone and reach forward with hip flexor stretch Lunge hip adductor stretch moving foot in and out    PATIENT EDUCATION: 04/26/23 Education details: Access Code: [email or web address] Person educated: Patient Education method: Explanation, Demonstration, Tactile cues, Verbal cues, and Handouts Education comprehension: verbalized understanding, returned demonstration, verbal cues required, tactile cues required, and needs further education    HOME EXERCISE PROGRAM: 04/19/23 Access Code: [email or web address] URL: [email or web address] Date: 04/19/2023 Prepared by: Channing Pereyra  Exercises - Seated Piriformis Stretch with Trunk Bend  - 1 x daily - 7 x weekly - 1 sets - 1 reps - 30 sec hold - Seated Happy Baby With Trunk Flexion For Pelvic Relaxation  - 1 x daily - 7 x weekly - 1 sets - 1 reps - 1 min hold - Supine  Piriformis Stretch with Leg Straight  - 1 x daily - 7 x weekly - 1 sets - 1 reps - 30 sec hold - Diaphragmatic Breathing in Child's Pose with Pelvic Floor Relaxation  - 1 x daily - 7 x weekly - 1 sets - 1 reps - 30  sec hold - Cat Cow  - 1 x daily - 7 x weekly - 1 sets - 10 reps - Supine Diaphragmatic Breathing  - 1 x daily - 7 x weekly - 1 sets - 10 reps - Supine Abdominal Wall Massage  - 1 x daily - 7 x weekly - 3 sets - 10 reps - Abdominal Scar Massage  - 1 x daily - 7 x weekly -  1 sets - 10 reps  ASSESSMENT:  CLINICAL IMPRESSION: Patient is a 43 y.o. female who was seen today for physical therapy treatment for pelvic and perineal pain, overactive bladder, mixed incontinence and endometriosis. Patient responded well with many trigger points in the pelvic floor with dry needling. Patient has tenderness in the left ovary and was decreased after the manual work. Patient has pain that is up and down.  Patient is able to perform diaphragmatic breathing with pelvic floor relaxation. Patient would benefit from skilled therapy to reduce her pain to improve her quality of life.   OBJECTIVE IMPAIRMENTS: decreased coordination, decreased endurance, decreased strength, increased fascial restrictions, increased muscle spasms, and pain.   ACTIVITY LIMITATIONS: lifting, bending, continence, toileting, and locomotion level  PARTICIPATION LIMITATIONS: meal prep, cleaning, laundry, interpersonal relationship, driving, shopping, community activity, and occupation  PERSONAL FACTORS: Fitness and 3+ comorbidities: RA TLH and Salpingectomy, LOA and Excision of endometriosis and cystoscopy with hydro distension on 09/23/22 for chronic pelvic pain;  Constipation due to slow transit ; seizures  are also affecting patient's functional outcome.   REHAB POTENTIAL: Excellent  CLINICAL DECISION MAKING: Evolving/moderate complexity  EVALUATION COMPLEXITY: Moderate   GOALS: Goals reviewed with patient? Yes  SHORT  TERM GOALS: Target date: 05/12/23  Patient independent with initial HEP for stretches and meditation.  Baseline: not educated yet Goal status: Met 04/26/23  2.  Patient able to perform diaphragmatic breathing to perform a pelvic floor drop.  Baseline: not educated yet Goal status: INITIAL  3.  Patient educated on how to breath, relax the pelvic floor and generate pressure to push the stool out when having a bowel movement.  Baseline: not educated yet Goal status: Met 05/10/23  4.  Patient educated on abdominal massage to work on peristalic motion of the intestines.  Baseline: not educated yet.  Goal status: Met 05/10/23   LONG TERM GOALS: Target date: 10/12/23  Patient independent with advanced HEP for core and pelvic floor.  Baseline: not educated yet Goal status: INITIAL  2.  Patient is able to walk to the bathroom without leaking urine due to the ability to fully relax and contract her pelvic floor to stop the flow of urine.  Baseline: She leaks as she is walking to the bathroom Goal status: INITIAL  3.  Patient is able to wait 1.5 to 2 hours to urinate due to the improved mobility of the diaphragm, tissue around the bladder and reduction of pain </= 4/10 so her bladder can fully fill.  Baseline: urinates every 30 minutes Goal status: INITIAL  4.  Patient reports she strains with bowel movements </= 50% of the time with pain level </= 2/10 Baseline: always strains and pain is 4/10 Goal status: INITIAL  5.  Patient understands ways to manage her pain with meditation, using a vaginal wand to massage the muscles, ice, heat, stretching.  Baseline: not educated yet Goal status: INITIAL    PLAN:  PT FREQUENCY: 1-2x/week  PT DURATION: 6 months  PLANNED INTERVENTIONS: 97110-Therapeutic exercises, 97530- Therapeutic activity, [id number]- Neuromuscular re-education, 97140- Manual therapy, 97014- Electrical stimulation (unattended), 97035- Ultrasound, Patient/Family education, Taping, Dry  Needling, Joint mobilization, Spinal mobilization, Cryotherapy, Moist heat, and Biofeedback  PLAN FOR NEXT SESSION: manual work to the left pelvic floor and right; SI joint opening with hip IR; mobilization of tissue around the coccyx  Channing Pereyra, PT 05/10/23 5:04 PM

## 2023-05-15 ENCOUNTER — Encounter: Payer: Self-pay | Admitting: Physical Therapy

## 2023-05-15 ENCOUNTER — Ambulatory Visit: Payer: 59 | Admitting: Physical Therapy

## 2023-05-15 DIAGNOSIS — R1030 Lower abdominal pain, unspecified: Secondary | ICD-10-CM

## 2023-05-15 DIAGNOSIS — R102 Pelvic and perineal pain unspecified side: Secondary | ICD-10-CM

## 2023-05-15 DIAGNOSIS — R278 Other lack of coordination: Secondary | ICD-10-CM

## 2023-05-15 NOTE — Therapy (Signed)
 OUTPATIENT PHYSICAL THERAPY FEMALE PELVIC TREATMENT   Patient Name: Janet Summers MRN: [id number] DOB:02-01-1981, 43 y.o., female Today's Date: 05/15/2023  END OF SESSION:  PT End of Session - 05/15/23 1620     Visit Number 6    Date for PT Re-Evaluation 10/12/23    Authorization Type [employer]    Authorization Time Period 04/14/23-06/12/23    Authorization - Visit Number 5    Authorization - Number of Visits 8    PT Start Time 1615    PT Stop Time 1655    PT Time Calculation (min) 40 min    Activity Tolerance Patient tolerated treatment well    Behavior During Therapy WFL for tasks assessed/performed             Past Medical History:  Diagnosis Date   Anemia    Anxiety    Constipation    Depression    Dyspareunia    Family history of adverse reaction to anesthesia    father wakes up combative   GERD (gastroesophageal reflux disease)    Headache    Onychomycosis    Pneumonia    Seizures (HCC)    Past Surgical History:  Procedure Laterality Date   COLONOSCOPY N/A 12/22/2014   Procedure: COLONOSCOPY;  Surgeon: Stephens Eis, MD;  Location: [facility] ENDOSCOPY;  Service: Gastroenterology;  Laterality: N/A;   CYSTOSCOPY N/A 09/23/2022   Procedure: CYSTOSCOPY WITH HYDRO DISTENSION;  Surgeon: Prescilla Brod, MD;  Location: [facility] ORS;  Service: Gynecology;  Laterality: N/A;   ESOPHAGOGASTRODUODENOSCOPY (EGD) WITH PROPOFOL  N/A 12/22/2014   Procedure: ESOPHAGOGASTRODUODENOSCOPY (EGD) WITH PROPOFOL ;  Surgeon: Stephens Eis, MD;  Location: [facility] ENDOSCOPY;  Service: Gastroenterology;  Laterality: N/A;   IUD REMOVAL N/A 09/23/2022   Procedure: INTRAUTERINE DEVICE (IUD) REMOVAL;  Surgeon: Prescilla Brod, MD;  Location: [facility] ORS;  Service: Gynecology;  Laterality: N/A;   LAPAROSCOPY N/A 09/23/2022   Procedure: LAPAROSCOPY DIAGNOSTIC EXCISION OF ENDOMETRIOSIS;  Surgeon: Prescilla Brod, MD;  Location: [facility] ORS;  Service: Gynecology;  Laterality: N/A;   LEEP     LYSIS OF ADHESION N/A  09/23/2022   Procedure: LYSIS OF ADHESION;  Surgeon: Prescilla Brod, MD;  Location: [facility] ORS;  Service: Gynecology;  Laterality: N/A;   ROBOTIC ASSISTED LAPAROSCOPIC HYSTERECTOMY AND SALPINGECTOMY Bilateral 09/23/2022   Procedure: XI ROBOTIC ASSISTED LAPAROSCOPIC HYSTERECTOMY AND SALPINGECTOMY;  Surgeon: Prescilla Brod, MD;  Location: [facility] ORS;  Service: Gynecology;  Laterality: Bilateral;   Patient Active Problem List   Diagnosis Date Noted   Seizure disorder (HCC) 02/18/2019   Chronic constipation 07/09/2014   HGSIL (high grade squamous intraepithelial lesion) on Pap smear of cervix 07/09/2014   Postcoital bleeding 07/09/2014    PCP: none  REFERRING PROVIDER: Prescilla Brod, MD   REFERRING DIAG:  R10.2 (ICD-10-CM) - Pelvic and perineal pain  N80.9 (ICD-10-CM) - Endometriosis, unspecified  N39.46 (ICD-10-CM) - Mixed incontinence  N32.81 (ICD-10-CM) - Overactive bladder    THERAPY DIAG:  Other lack of coordination  Pelvic pain  Lower abdominal pain  Rationale for Evaluation and Treatment: Rehabilitation  ONSET DATE: 2016  SUBJECTIVE:  SUBJECTIVE STATEMENT: I felt better for a little bit after last visit.   PAIN:  Are you having pain? Yes NPRS scale: 3/10 04/26/23 Pain location:  abdomen and hips  Pain type: stabbing, pulling, squeezing, cutting Pain description: constant 2/3 and intermittent is 10/10   Aggravating factors: penile penetration vaginally, lifting heavy items Relieving factors: ice  PRECAUTIONS: None  RED FLAGS: None   WEIGHT BEARING RESTRICTIONS: No  FALLS:  Has patient fallen in last 6 months? Yes. Number of falls tripped over dogs and not due to balance and 1 time due to seizures  LIVING ENVIRONMENT: Lives with: lives with their family   OCCUPATION:  sitting job  PLOF: Independent  PATIENT GOALS: reduce the pain, stronger  PERTINENT HISTORY:  RA TLH and Salpingectomy, LOA and Excision of endometriosis and cystoscopy with hydro distension on 09/23/22 for chronic pelvic pain;  Constipation due to slow transit ; seizures Sexual abuse: No  BOWEL MOVEMENT: Pain with bowel movement: Yes, intermittent 3-4/10 Type of bowel movement:Type (Bristol Stool Scale) Type 2, 3, 4, Frequency every other day, Strain Yes, and Splinting no Fully empty rectum: Yes:   Leakage: No Pads: No Fiber supplement: No  URINATION: Pain with urination: Yes, intermittent 7/10 and located in the lower abdominal area Fully empty bladder: Yes:   Stream: Strong Urgency: Yes: needs to get to the bathroom and leaks urine as she goes to the bathroom Frequency: can be every 30 minutes Leakage: Urge to void, Walking to the bathroom, Laughing, Lifting, and Bending forward; when leaks it is fully emptying her bladder Pads: No  INTERCOURSE: Pain with intercourse:  stopped intercourse due to the pain level  and last time was 10 months  PREGNANCY: Vaginal deliveries 2 Tearing Yes:      OBJECTIVE:  Note: Objective measures were completed at Evaluation unless otherwise noted.  DIAGNOSTIC FINDINGS:  Operative report and pathology were reviewed. Operative findings: Endometriosis stage 3  Pelvic: External genitalia negative for lesions. Vagina negative. Adnexa negative for masses or nodularity. Cervix without gross lesions. Uterus mobile, anteverted, small.   Intraoperative findings revealed a normal upper abdomen including bowel, diaphragmatic surfaces, stomach, and omentum. However, the peritoneum in the pelvis was dotted with fibrotic tissue.  The uterus was small and mobile.  The right and left ovaries were scarred into the ovarian fossae.  Bilateral tubes appeared normal except the endometriosis adhesions to the ovaries.  Appendix wnl  The bowels appeared to  have non-inflammed diverticula and endometriosis scarring.  No Hunner's ulcers noted in the bladder after hydrodistension   PATIENT SURVEYS:  PFIQ-7 186 UIQ-7 81 POPIQ-7 100 CRAIQ-7 5  COGNITION: Overall cognitive status: Within functional limits for tasks assessed     SENSATION: Light touch: Appears intact Proprioception: Appears intact     POSTURE: No Significant postural limitations  PELVIC ALIGNMENT:  LUMBARAROM/PROM: lumbar ROM is full    LOWER EXTREMITY ROM: bilateral hip ROM is full   LOWER EXTREMITY MMT: bilateral hip strength 5/5   PALPATION:   General  tenderness located in the abdomen, along the lumbar and gluteals, decreased movement of the lower rib cage                External Perineal Exam tenderness located on the ischiocavernosus, bulbocavernosus, perineal body, levator ani, and obturator internist, and around the anus, Q-tip test with pain at 7, 1, 5, and 6 O'clock;  Internal Pelvic Floor pain with inserting the Q-tip therefore did not assess the pelvic floor internally  Patient confirms identification and approves PT to assess internal pelvic floor and treatment Yes  PELVIC MMT:   MMT 05/10/23 05/15/23  Vaginal 3/5 3/5  Internal Anal Sphincter    External Anal Sphincter    Puborectalis    Diastasis Recti    (Blank rows = not tested)        TONE: increased  PROLAPSE: none  TODAY'S TREATMENT:   05/15/23 Manual: Internal pelvic floor techniques: No emotional/communication barriers or cognitive limitation. Patient is motivated to learn. Patient understands and agrees with treatment goals and plan. PT explains patient will be examined in standing, sitting, and lying down to see how their muscles and joints work. When they are ready, they will be asked to remove their underwear so PT can examine their perineum. The patient is also given the option of providing their own chaperone as one is not provided in our  facility. The patient also has the right and is explained the right to defer or refuse any part of the evaluation or treatment including the internal exam. With the patient's consent, PT will use one gloved finger to gently assess the muscles of the pelvic floor, seeing how well it contracts and relaxes and if there is muscle symmetry. After, the patient will get dressed and PT and patient will discuss exam findings and plan of care. PT and patient discuss plan of care, schedule, attendance policy and HEP activities.  Going through the vaginal canal working on the left side of the levator ani and obturator internist moving the left hip into different directions. Then worked on the left anterior wall of the vaginal canal working on the obturator internist Exercises: Strengthening: Nustep level 5 for 5 min while assess patient Stand and reach to right foot holding 5 # then come into standing with left foot ahead of right 10 x then switch Dead lift with holding 5 # in each hand 15 x  Lunge without weight and verbal cues to not drop her hip  Move 5 # wt around head to engage the abdominals 10 x each way Bilateral shoulder extension on one foot with green band 10 x each leg    05/10/23 Manual: Soft tissue mobilization: To assess for dry needling,  Manual work to the perineal body and ischiocavernosus externally to elongate after the dry needling.  Internal pelvic floor techniques: No emotional/communication barriers or cognitive limitation. Patient is motivated to learn. Patient understands and agrees with treatment goals and plan. PT explains patient will be examined in standing, sitting, and lying down to see how their muscles and joints work. When they are ready, they will be asked to remove their underwear so PT can examine their perineum. The patient is also given the option of providing their own chaperone as one is not provided in our facility. The patient also has the right and is explained the  right to defer or refuse any part of the evaluation or treatment including the internal exam. With the patient's consent, PT will use one gloved finger to gently assess the muscles of the pelvic floor, seeing how well it contracts and relaxes and if there is muscle symmetry. After, the patient will get dressed and PT and patient will discuss exam findings and plan of care. PT and patient discuss plan of care, schedule, attendance policy and HEP activities.  Going through the vaginal canal to perform manual work to  the perineal body, left ischiocavernosus, obturator internist, coccygeus and ATLA ligament with left hip movement, trunk movement, and pelvic tilts  Manual work to the area where the left ovary is internally and externally with trunk rotation.  Trigger Point Dry Needling  Subsequent Treatment: Instructions provided previously at initial dry needling treatment.  Instructions reviewed, if requested by the patient, prior to subsequent dry needling treatment.   Patient Verbal Consent Given: Yes Education Handout Provided: Previously Provided Muscles Treated: left perineal body, coccygeus, ischiocavernosus, obturator internist Electrical Stimulation Performed: No Treatment Response/Outcome: elongation of muscle and trigger point response Exercises: Stretches/mobility: Lay prone on small ball on the lower quadrant of the abdomen to massage the tender points Quadruped with hip internal rotation rocking back and forth to stretch rotators Half kneel hip flexor stretch with right trunk sidebend    05/03/23 Manual: Soft tissue mobilization: to decrease pelvic floor muscle tension externally  Adductor group left  Ischiocavernosus  Obturator internus  Myofascial release: Manual release of th obturator internist on the left hand side Internal pelvic floor techniques: No emotional/communication barriers or cognitive limitation. Patient is motivated to learn. Patient understands and agrees with  treatment goals and plan. PT explains patient will be examined in standing, sitting, and lying down to see how their muscles and joints work. When they are ready, they will be asked to remove their underwear so PT can examine their perineum. The patient is also given the option of providing their own chaperone as one is not provided in our facility. The patient also has the right and is explained the right to defer or refuse any part of the evaluation or treatment including the internal exam. With the patient's consent, PT will use one gloved finger to gently assess the muscles of the pelvic floor, seeing how well it contracts and relaxes and if there is muscle symmetry. After, the patient will get dressed and PT and patient will discuss exam findings and plan of care. PT and patient discuss plan of care, schedule, attendance policy and HEP activities.  Left obturator internus trigger point release with external rotation passive range of motion of the hip Therapist finger in the vaginal canal working on the left iliococcygeus, left obturator internist, puborectalis Fascial release of the left anterior wall by the left ovary to release the restrictions Therapeutic activities: Functional strengthening activities: Discussed with patient her ergonomics in the office to reduce her trunk rotation while reaching for the printer to reduce strain on the pelvic floor and left hip       PATIENT EDUCATION: 04/26/23 Education details: Access Code: [email or web address] Person educated: Patient Education method: Explanation, Demonstration, Tactile cues, Verbal cues, and Handouts Education comprehension: verbalized understanding, returned demonstration, verbal cues required, tactile cues required, and needs further education    HOME EXERCISE PROGRAM: 04/19/23 Access Code: [email or web address] URL: [email or web address] Date: 04/19/2023 Prepared by: Marsha Skeen  Exercises - Seated Piriformis Stretch with Trunk Bend   - 1 x daily - 7 x weekly - 1 sets - 1 reps - 30 sec hold - Seated Happy Baby With Trunk Flexion For Pelvic Relaxation  - 1 x daily - 7 x weekly - 1 sets - 1 reps - 1 min hold - Supine Piriformis Stretch with Leg Straight  - 1 x daily - 7 x weekly - 1 sets - 1 reps - 30 sec hold - Diaphragmatic Breathing in Child's Pose with Pelvic Floor Relaxation  - 1 x daily - 7 x weekly - 1 sets - 1  reps - 30  sec hold - Cat Cow  - 1 x daily - 7 x weekly - 1 sets - 10 reps - Supine Diaphragmatic Breathing  - 1 x daily - 7 x weekly - 1 sets - 10 reps - Supine Abdominal Wall Massage  - 1 x daily - 7 x weekly - 3 sets - 10 reps - Abdominal Scar Massage  - 1 x daily - 7 x weekly - 1 sets - 10 reps  ASSESSMENT:  CLINICAL IMPRESSION: Patient is a 43 y.o. female who was seen today for physical therapy treatment for pelvic and perineal pain, overactive bladder, mixed incontinence and endometriosis. Patient pain is 2/10. She has improved mobility and softness of the pelvic floor tissue. Pelvic floor strength si 3/10 and able to fully relax after contraction. Patient has a reduction of urinary leakage. Patient would benefit from skilled therapy to reduce her pain to improve her quality of life.   OBJECTIVE IMPAIRMENTS: decreased coordination, decreased endurance, decreased strength, increased fascial restrictions, increased muscle spasms, and pain.   ACTIVITY LIMITATIONS: lifting, bending, continence, toileting, and locomotion level  PARTICIPATION LIMITATIONS: meal prep, cleaning, laundry, interpersonal relationship, driving, shopping, community activity, and occupation  PERSONAL FACTORS: Fitness and 3+ comorbidities: RA TLH and Salpingectomy, LOA and Excision of endometriosis and cystoscopy with hydro distension on 09/23/22 for chronic pelvic pain;  Constipation due to slow transit ; seizures  are also affecting patient's functional outcome.   REHAB POTENTIAL: Excellent  CLINICAL DECISION MAKING: Evolving/moderate  complexity  EVALUATION COMPLEXITY: Moderate   GOALS: Goals reviewed with patient? Yes  SHORT TERM GOALS: Target date: 05/12/23  Patient independent with initial HEP for stretches and meditation.  Baseline: not educated yet Goal status: Met 04/26/23  2.  Patient able to perform diaphragmatic breathing to perform a pelvic floor drop.  Baseline: not educated yet Goal status: Met 05/15/23  3.  Patient educated on how to breath, relax the pelvic floor and generate pressure to push the stool out when having a bowel movement.  Baseline: not educated yet Goal status: Met 05/10/23  4.  Patient educated on abdominal massage to work on peristalic motion of the intestines.  Baseline: not educated yet.  Goal status: Met 05/10/23   LONG TERM GOALS: Target date: 10/12/23  Patient independent with advanced HEP for core and pelvic floor.  Baseline: not educated yet Goal status: INITIAL  2.  Patient is able to walk to the bathroom without leaking urine due to the ability to fully relax and contract her pelvic floor to stop the flow of urine.  Baseline: She leaks as she is walking to the bathroom Goal status: INITIAL  3.  Patient is able to wait 1.5 to 2 hours to urinate due to the improved mobility of the diaphragm, tissue around the bladder and reduction of pain </= 4/10 so her bladder can fully fill.  Baseline: urinates every 30 minutes Goal status: INITIAL  4.  Patient reports she strains with bowel movements </= 50% of the time with pain level </= 2/10 Baseline: always strains and pain is 4/10 Goal status: INITIAL  5.  Patient understands ways to manage her pain with meditation, using a vaginal wand to massage the muscles, ice, heat, stretching.  Baseline: not educated yet Goal status: INITIAL    PLAN:  PT FREQUENCY: 1-2x/week  PT DURATION: 6 months  PLANNED INTERVENTIONS: 97110-Therapeutic exercises, 97530- Therapeutic activity, [id number]- Neuromuscular re-education, 97140- Manual  therapy, 97014- Electrical stimulation (unattended), [id number]- Ultrasound, Patient/Family education,  Taping, Dry Needling, Joint mobilization, Spinal mobilization, Cryotherapy, Moist heat, and Biofeedback  PLAN FOR NEXT SESSION: manual work if needed, core strength  Marsha Skeen, PT 05/15/23 4:20 PM

## 2023-05-17 DIAGNOSIS — M4317 Spondylolisthesis, lumbosacral region: Secondary | ICD-10-CM | POA: Insufficient documentation

## 2023-05-19 ENCOUNTER — Encounter: Payer: Self-pay | Admitting: Physical Therapy

## 2023-05-22 ENCOUNTER — Encounter: Payer: Self-pay | Admitting: Physical Therapy

## 2023-05-22 ENCOUNTER — Other Ambulatory Visit: Payer: Self-pay | Admitting: Sports Medicine

## 2023-05-22 DIAGNOSIS — M25852 Other specified joint disorders, left hip: Secondary | ICD-10-CM

## 2023-05-22 DIAGNOSIS — M25552 Pain in left hip: Secondary | ICD-10-CM

## 2023-05-24 ENCOUNTER — Other Ambulatory Visit: Payer: Self-pay | Admitting: Sports Medicine

## 2023-05-24 DIAGNOSIS — M25852 Other specified joint disorders, left hip: Secondary | ICD-10-CM

## 2023-05-24 DIAGNOSIS — M25552 Pain in left hip: Secondary | ICD-10-CM

## 2023-05-26 ENCOUNTER — Telehealth: Payer: Self-pay | Admitting: Physical Therapy

## 2023-05-26 ENCOUNTER — Ambulatory Visit: Payer: 59 | Admitting: Physical Therapy

## 2023-05-26 NOTE — Telephone Encounter (Signed)
Called patient about her missed appointment today at 11:00. She thought she canceled it on my chart and changed it to Monday.  Eulis Foster, PT @2 /21/25@ 11:46 AM

## 2023-05-29 ENCOUNTER — Encounter: Payer: Self-pay | Admitting: Physical Therapy

## 2023-06-05 ENCOUNTER — Encounter: Payer: Self-pay | Admitting: Physical Therapy

## 2023-06-07 ENCOUNTER — Encounter: Payer: Self-pay | Admitting: Physical Therapy

## 2023-06-07 ENCOUNTER — Ambulatory Visit: Attending: Obstetrics and Gynecology | Admitting: Physical Therapy

## 2023-06-07 DIAGNOSIS — N3946 Mixed incontinence: Secondary | ICD-10-CM | POA: Diagnosis not present

## 2023-06-07 DIAGNOSIS — N3281 Overactive bladder: Secondary | ICD-10-CM | POA: Insufficient documentation

## 2023-06-07 DIAGNOSIS — R102 Pelvic and perineal pain unspecified side: Secondary | ICD-10-CM

## 2023-06-07 DIAGNOSIS — R278 Other lack of coordination: Secondary | ICD-10-CM | POA: Diagnosis present

## 2023-06-07 DIAGNOSIS — R1030 Lower abdominal pain, unspecified: Secondary | ICD-10-CM | POA: Diagnosis present

## 2023-06-07 DIAGNOSIS — N809 Endometriosis, unspecified: Secondary | ICD-10-CM | POA: Insufficient documentation

## 2023-06-07 NOTE — Therapy (Signed)
 OUTPATIENT PHYSICAL THERAPY FEMALE PELVIC TREATMENT   Patient Name: Janet Summers MRN: [id number] DOB:[birth year], 43 y.o., female Today's Date: 06/07/2023  END OF SESSION:  PT End of Session - 06/07/23 1450     Visit Number 7    Date for PT Re-Evaluation 10/12/23    Authorization Type [employer]    Authorization Time Period 04/14/23-06/12/23    Authorization - Visit Number 6    Authorization - Number of Visits 8    PT Start Time 1445    PT Stop Time 1525    PT Time Calculation (min) 40 min    Activity Tolerance Patient tolerated treatment well    Behavior During Therapy WFL for tasks assessed/performed             Past Medical History:  Diagnosis Date   Anemia    Anxiety    Constipation    Depression    Dyspareunia    Family history of adverse reaction to anesthesia    father wakes up combative   GERD (gastroesophageal reflux disease)    Headache    Onychomycosis    Pneumonia    Seizures (HCC)    Past Surgical History:  Procedure Laterality Date   COLONOSCOPY N/A 12/22/2014   Procedure: COLONOSCOPY;  Surgeon: Wallace Cullens, MD;  Location: [facility] ENDOSCOPY;  Service: Gastroenterology;  Laterality: N/A;   CYSTOSCOPY N/A 09/23/2022   Procedure: CYSTOSCOPY WITH HYDRO DISTENSION;  Surgeon: Christeen Douglas, MD;  Location: [facility] ORS;  Service: Gynecology;  Laterality: N/A;   ESOPHAGOGASTRODUODENOSCOPY (EGD) WITH PROPOFOL N/A 12/22/2014   Procedure: ESOPHAGOGASTRODUODENOSCOPY (EGD) WITH PROPOFOL;  Surgeon: Wallace Cullens, MD;  Location: [facility] ENDOSCOPY;  Service: Gastroenterology;  Laterality: N/A;   IUD REMOVAL N/A 09/23/2022   Procedure: INTRAUTERINE DEVICE (IUD) REMOVAL;  Surgeon: Christeen Douglas, MD;  Location: [facility] ORS;  Service: Gynecology;  Laterality: N/A;   LAPAROSCOPY N/A 09/23/2022   Procedure: LAPAROSCOPY DIAGNOSTIC EXCISION OF ENDOMETRIOSIS;  Surgeon: Christeen Douglas, MD;  Location: [facility] ORS;  Service: Gynecology;  Laterality: N/A;   LEEP     LYSIS OF ADHESION N/A  09/23/2022   Procedure: LYSIS OF ADHESION;  Surgeon: Christeen Douglas, MD;  Location: [facility] ORS;  Service: Gynecology;  Laterality: N/A;   ROBOTIC ASSISTED LAPAROSCOPIC HYSTERECTOMY AND SALPINGECTOMY Bilateral 09/23/2022   Procedure: XI ROBOTIC ASSISTED LAPAROSCOPIC HYSTERECTOMY AND SALPINGECTOMY;  Surgeon: Christeen Douglas, MD;  Location: [facility] ORS;  Service: Gynecology;  Laterality: Bilateral;   Patient Active Problem List   Diagnosis Date Noted   Seizure disorder (HCC) 02/18/2019   Chronic constipation 07/09/2014   HGSIL (high grade squamous intraepithelial lesion) on Pap smear of cervix 07/09/2014   Postcoital bleeding 07/09/2014    PCP: none  REFERRING PROVIDER: Christeen Douglas, MD   REFERRING DIAG:  R10.2 (ICD-10-CM) - Pelvic and perineal pain  N80.9 (ICD-10-CM) - Endometriosis, unspecified  N39.46 (ICD-10-CM) - Mixed incontinence  N32.81 (ICD-10-CM) - Overactive bladder    THERAPY DIAG:  Other lack of coordination  Pelvic pain  Lower abdominal pain  Rationale for Evaluation and Treatment: Rehabilitation  ONSET DATE: 2016  SUBJECTIVE:  SUBJECTIVE STATEMENT: I felt better for a little bit after last visit.   PAIN:  Are you having pain? Yes NPRS scale: 3/10 04/26/23 Pain location:  abdomen and hips  Pain type: stabbing, pulling, squeezing, cutting Pain description: constant 2/3 and intermittent is 10/10   Aggravating factors: penile penetration vaginally, lifting heavy items Relieving factors: ice  PRECAUTIONS: None  RED FLAGS: None   WEIGHT BEARING RESTRICTIONS: No  FALLS:  Has patient fallen in last 6 months? Yes. Number of falls tripped over dogs and not due to balance and 1 time due to seizures  LIVING ENVIRONMENT: Lives with: lives with their family   OCCUPATION:  sitting job  PLOF: Independent  PATIENT GOALS: reduce the pain, stronger  PERTINENT HISTORY:  RA TLH and Salpingectomy, LOA and Excision of endometriosis and cystoscopy with hydro distension on 09/23/22 for chronic pelvic pain;  Constipation due to slow transit ; seizures Sexual abuse: No  BOWEL MOVEMENT: Pain with bowel movement: Yes, intermittent 3-4/10 Type of bowel movement:Type (Bristol Stool Scale) Type 2, 3, 4, Frequency every other day, Strain Yes, and Splinting no Fully empty rectum: Yes:   Leakage: No Pads: No Fiber supplement: No  URINATION: Pain with urination: Yes, intermittent 7/10 and located in the lower abdominal area Fully empty bladder: Yes:   Stream: Strong Urgency: Yes: needs to get to the bathroom and leaks urine as she goes to the bathroom Frequency: can be every 30 minutes Leakage: Urge to void, Walking to the bathroom, Laughing, Lifting, and Bending forward; when leaks it is fully emptying her bladder Pads: No  INTERCOURSE: Pain with intercourse:  stopped intercourse due to the pain level  and last time was 10 months  PREGNANCY: Vaginal deliveries 2 Tearing Yes:      OBJECTIVE:  Note: Objective measures were completed at Evaluation unless otherwise noted.  DIAGNOSTIC FINDINGS:  Operative report and pathology were reviewed. Operative findings: Endometriosis stage 3  Pelvic: External genitalia negative for lesions. Vagina negative. Adnexa negative for masses or nodularity. Cervix without gross lesions. Uterus mobile, anteverted, small.   Intraoperative findings revealed a normal upper abdomen including bowel, diaphragmatic surfaces, stomach, and omentum. However, the peritoneum in the pelvis was dotted with fibrotic tissue.  The uterus was small and mobile.  The right and left ovaries were scarred into the ovarian fossae.  Bilateral tubes appeared normal except the endometriosis adhesions to the ovaries.  Appendix wnl  The bowels appeared to  have non-inflammed diverticula and endometriosis scarring.  No Hunner's ulcers noted in the bladder after hydrodistension   PATIENT SURVEYS:  PFIQ-7 186 UIQ-7 81 POPIQ-7 100 CRAIQ-7 5  06/07/23: PFIQ-7 38 UIQ-7 14 POPIQ-7 24 CRAIQ-7 0  COGNITION: Overall cognitive status: Within functional limits for tasks assessed     SENSATION: Light touch: Appears intact Proprioception: Appears intact     POSTURE: No Significant postural limitations  PELVIC ALIGNMENT:  LUMBARAROM/PROM: lumbar ROM is full    LOWER EXTREMITY ROM: bilateral hip ROM is full   LOWER EXTREMITY MMT: bilateral hip strength 5/5   PALPATION:   General  tenderness located in the abdomen, along the lumbar and gluteals, decreased movement of the lower rib cage                External Perineal Exam tenderness located on the ischiocavernosus, bulbocavernosus, perineal body, levator ani, and obturator internist, and around the anus, Q-tip test with pain at 7, 1, 5, and 6 O'clock;  Internal Pelvic Floor pain with inserting the Q-tip therefore did not assess the pelvic floor internally  Patient confirms identification and approves PT to assess internal pelvic floor and treatment Yes  PELVIC MMT:   MMT 05/10/23 05/15/23 06/07/23  Vaginal 3/5 3/5 4/5  (Blank rows = not tested)        TONE: increased  PROLAPSE: none  TODAY'S TREATMENT:   06/07/23 Manual: Internal pelvic floor techniques: No emotional/communication barriers or cognitive limitation. Patient is motivated to learn. Patient understands and agrees with treatment goals and plan. PT explains patient will be examined in standing, sitting, and lying down to see how their muscles and joints work. When they are ready, they will be asked to remove their underwear so PT can examine their perineum. The patient is also given the option of providing their own chaperone as one is not provided in our facility. The patient also has the  right and is explained the right to defer or refuse any part of the evaluation or treatment including the internal exam. With the patient's consent, PT will use one gloved finger to gently assess the muscles of the pelvic floor, seeing how well it contracts and relaxes and if there is muscle symmetry. After, the patient will get dressed and PT and patient will discuss exam findings and plan of care. PT and patient discuss plan of care, schedule, attendance policy and HEP activities.  Going through the vaginal canal working on the left iliococcygeus, left obturator internist, and anterior left vaginal canal to reduce trigger points and restrictions around the left ovary.  Therapeutic exercise: verbally reviewed HEP and to continue due to her pelvic floor strength has increased.    05/15/23 Manual: Internal pelvic floor techniques: No emotional/communication barriers or cognitive limitation. Patient is motivated to learn. Patient understands and agrees with treatment goals and plan. PT explains patient will be examined in standing, sitting, and lying down to see how their muscles and joints work. When they are ready, they will be asked to remove their underwear so PT can examine their perineum. The patient is also given the option of providing their own chaperone as one is not provided in our facility. The patient also has the right and is explained the right to defer or refuse any part of the evaluation or treatment including the internal exam. With the patient's consent, PT will use one gloved finger to gently assess the muscles of the pelvic floor, seeing how well it contracts and relaxes and if there is muscle symmetry. After, the patient will get dressed and PT and patient will discuss exam findings and plan of care. PT and patient discuss plan of care, schedule, attendance policy and HEP activities.  Going through the vaginal canal working on the left side of the levator ani and obturator internist moving  the left hip into different directions. Then worked on the left anterior wall of the vaginal canal working on the obturator internist Exercises: Strengthening: Nustep level 5 for 5 min while assess patient Stand and reach to right foot holding 5 # then come into standing with left foot ahead of right 10 x then switch Dead lift with holding 5 # in each hand 15 x  Lunge without weight and verbal cues to not drop her hip  Move 5 # wt around head to engage the abdominals 10 x each way Bilateral shoulder extension on one foot with green band 10 x each leg    05/10/23 Manual: Soft tissue mobilization: To  assess for dry needling,  Manual work to the perineal body and ischiocavernosus externally to elongate after the dry needling.  Internal pelvic floor techniques: No emotional/communication barriers or cognitive limitation. Patient is motivated to learn. Patient understands and agrees with treatment goals and plan. PT explains patient will be examined in standing, sitting, and lying down to see how their muscles and joints work. When they are ready, they will be asked to remove their underwear so PT can examine their perineum. The patient is also given the option of providing their own chaperone as one is not provided in our facility. The patient also has the right and is explained the right to defer or refuse any part of the evaluation or treatment including the internal exam. With the patient's consent, PT will use one gloved finger to gently assess the muscles of the pelvic floor, seeing how well it contracts and relaxes and if there is muscle symmetry. After, the patient will get dressed and PT and patient will discuss exam findings and plan of care. PT and patient discuss plan of care, schedule, attendance policy and HEP activities.  Going through the vaginal canal to perform manual work to the perineal body, left ischiocavernosus, obturator internist, coccygeus and ATLA ligament with left hip  movement, trunk movement, and pelvic tilts  Manual work to the area where the left ovary is internally and externally with trunk rotation.  Trigger Point Dry Needling  Subsequent Treatment: Instructions provided previously at initial dry needling treatment.  Instructions reviewed, if requested by the patient, prior to subsequent dry needling treatment.   Patient Verbal Consent Given: Yes Education Handout Provided: Previously Provided Muscles Treated: left perineal body, coccygeus, ischiocavernosus, obturator internist Electrical Stimulation Performed: No Treatment Response/Outcome: elongation of muscle and trigger point response Exercises: Stretches/mobility: Lay prone on small ball on the lower quadrant of the abdomen to massage the tender points Quadruped with hip internal rotation rocking back and forth to stretch rotators Half kneel hip flexor stretch with right trunk sidebend       PATIENT EDUCATION: 04/26/23 Education details: Access Code: [email or web address] Person educated: Patient Education method: Explanation, Demonstration, Tactile cues, Verbal cues, and Handouts Education comprehension: verbalized understanding, returned demonstration, verbal cues required, tactile cues required, and needs further education    HOME EXERCISE PROGRAM: 04/19/23 Access Code: [email or web address] URL: [email or web address] Date: 04/19/2023 Prepared by: Eulis Foster  Exercises - Seated Piriformis Stretch with Trunk Bend  - 1 x daily - 7 x weekly - 1 sets - 1 reps - 30 sec hold - Seated Happy Baby With Trunk Flexion For Pelvic Relaxation  - 1 x daily - 7 x weekly - 1 sets - 1 reps - 1 min hold - Supine Piriformis Stretch with Leg Straight  - 1 x daily - 7 x weekly - 1 sets - 1 reps - 30 sec hold - Diaphragmatic Breathing in Child's Pose with Pelvic Floor Relaxation  - 1 x daily - 7 x weekly - 1 sets - 1 reps - 30  sec hold - Cat Cow  - 1 x daily - 7 x weekly - 1 sets - 10 reps - Supine  Diaphragmatic Breathing  - 1 x daily - 7 x weekly - 1 sets - 10 reps - Supine Abdominal Wall Massage  - 1 x daily - 7 x weekly - 3 sets - 10 reps - Abdominal Scar Massage  - 1 x daily - 7 x weekly - 1 sets - 10 reps  ASSESSMENT:  CLINICAL  IMPRESSION: Patient is a 43 y.o. female who was seen today for physical therapy treatment for pelvic and perineal pain, overactive bladder, mixed incontinence and endometriosis. Patient pain is 2/10. She has improved mobility and softness of the pelvic floor tissue. Patient has a reduction of urinary leakage. Pelvic floor strength increased to 4/5. She will strain on occasion to have a bowel movement and leak going to the bathroom on occasion. Patient has 2/10 pain randomly with a bowel movement. Patient is having her left hip assessed and waiting for a MRI. She has some deficits in the left hip and has a doctor following her. She will work with her left hip pain at this time and be discharged for the pelvic floor.   OBJECTIVE IMPAIRMENTS: decreased coordination, decreased endurance, decreased strength, increased fascial restrictions, increased muscle spasms, and pain.   ACTIVITY LIMITATIONS: lifting, bending, continence, toileting, and locomotion level  PARTICIPATION LIMITATIONS: meal prep, cleaning, laundry, interpersonal relationship, driving, shopping, community activity, and occupation  PERSONAL FACTORS: Fitness and 3+ comorbidities: RA TLH and Salpingectomy, LOA and Excision of endometriosis and cystoscopy with hydro distension on 09/23/22 for chronic pelvic pain;  Constipation due to slow transit ; seizures  are also affecting patient's functional outcome.   REHAB POTENTIAL: Excellent  CLINICAL DECISION MAKING: Evolving/moderate complexity  EVALUATION COMPLEXITY: Moderate   GOALS: Goals reviewed with patient? Yes  SHORT TERM GOALS: Target date: 05/12/23  Patient independent with initial HEP for stretches and meditation.  Baseline: not educated  yet Goal status: Met 04/26/23  2.  Patient able to perform diaphragmatic breathing to perform a pelvic floor drop.  Baseline: not educated yet Goal status: Met 05/15/23  3.  Patient educated on how to breath, relax the pelvic floor and generate pressure to push the stool out when having a bowel movement.  Baseline: not educated yet Goal status: Met 05/10/23  4.  Patient educated on abdominal massage to work on peristalic motion of the intestines.  Baseline: not educated yet.  Goal status: Met 05/10/23   LONG TERM GOALS: Target date: 10/12/23  Patient independent with advanced HEP for core and pelvic floor.  Baseline: not educated yet Goal status: Met 06/07/23  2.  Patient is able to walk to the bathroom without leaking urine due to the ability to fully relax and contract her pelvic floor to stop the flow of urine.  Baseline:  Goal status: Partially Met 06/07/23  3.  Patient is able to wait 1.5 to 2 hours to urinate due to the improved mobility of the diaphragm, tissue around the bladder and reduction of pain </= 4/10 so her bladder can fully fill.  Baseline:  Goal status: Met 06/07/23  4.  Patient reports she strains with bowel movements </= 50% of the time with pain level </= 2/10 Baseline: always strains and pain is 4/10 Goal status: Met 06/07/23  5.  Patient understands ways to manage her pain with meditation, using a vaginal wand to massage the muscles, ice, heat, stretching.  Baseline: not educated yet Goal status: Met 06/07/23    PLAN:Discharge to HEP  Eulis Foster, PT 06/07/23 3:30 PM  PHYSICAL THERAPY DISCHARGE SUMMARY  Visits from Start of Care: 7  Current functional level related to goals / functional outcomes: See above.    Remaining deficits: See above.    Education / Equipment: HEP   Patient agrees to discharge. Patient goals were met. Patient is being discharged due to meeting the stated rehab goals.Thank you for the referral.  Elnita Maxwell  Wallace Cullens, PT 06/07/23 3:30  PM

## 2023-06-08 ENCOUNTER — Encounter: Payer: Self-pay | Admitting: Podiatry

## 2023-06-08 ENCOUNTER — Ambulatory Visit: Payer: 59 | Admitting: Podiatry

## 2023-06-08 DIAGNOSIS — L603 Nail dystrophy: Secondary | ICD-10-CM | POA: Diagnosis not present

## 2023-06-08 NOTE — Progress Notes (Signed)
 She presents today for follow-up of her nail fungus is completed her first every other day dosing regimen she states they are looking 1,000,000% better she is very happy with the outcome so far denies any problems taking medication fever chills nausea vomit muscle aches pains calf pain back pain chest pain shortness of breath itching or rashes.  Objective: Vital signs are stable alert oriented x 3 toenails are growing out 100% they look great completely clear at this point in time no ridges and no discoloration.  Assessment: Well-healing onychomycosis with long-term therapy with Lamisil.  Plan: Follow-up with me on an as-needed basis.

## 2023-06-28 ENCOUNTER — Ambulatory Visit: Payer: 59 | Admitting: Physical Therapy

## 2023-07-03 ENCOUNTER — Encounter: Payer: 59 | Admitting: Physical Therapy

## 2023-08-07 ENCOUNTER — Encounter: Payer: Self-pay | Admitting: Physical Therapy

## 2023-11-06 NOTE — Progress Notes (Signed)
 [facility] Chief Complaint:   Chief Complaint  Patient presents with  . Left Hip - Pain, Follow-up    History of Present Illness:    Janet Summers is a 43 y.o. female that presents to clinic today for follow up evaluation and management of chronic left hip pain suspected to be due to impingement syndrome.  They were last evaluated by myself on 05/17/2023.  At that time, the plan was to order a left hip MRI, continue physical therapy, continue over-the-counter medicine.  Her MRI was declined due to not completing physical therapy.  She comes in today for reevaluation due to ongoing symptoms.  At the time of this visit, I reviewed her most recent physical therapy note from 06/07/2023.  She had done physical therapy from 1/10-06/07/2023.  Her most recent labs from 05/12/2023 show creatinine 0.8, normal electrolytes, normal liver function, albumin 4.5, normal CBC.   Today, the patient reports their symptoms are still persisting around her left groin, lateral hip, and thigh.  She denies any symptoms in her low back or distally in her extremity.  She currently rates pain severity as a 5/10.  She reports associated locking/catching, pain at night.  She denies associated swelling, instability, numbness or tingling, weakness, fevers or chills, night sweats, loss, skin color change.  She has done treatment with anti-inflammatories, gabapentin, pelvic floor physical therapy including some dry needling around her left hip.  She has also had other treatments from her OB/Gyn for chronic pelvic pains.   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   She stays active with Pilates, walking daily.   She feels significant limitation in function and various activities of daily living because of her pain.  Medications, Past Medical/Surgical/Family/Social History:   Current Outpatient Medications  Medication Sig Dispense Refill  .  diazePAM (VALIUM) 5 MG tablet Place vaginally nightly as needed for pelvic pain. Use up to 14 days in a row. 30 tablet 0  . ergocalciferol, vitamin D2, 1,250 mcg (50,000 unit) capsule Take 50,000 Units by mouth once a week    . gabapentin (NEURONTIN) 100 MG capsule TAKE 1 CAPSULE BY MOUTH EVERY NIGHT AT BEDTIME 30 capsule 2  . ibuprofen (MOTRIN) 800 MG tablet     . lamoTRIgine (LAMICTAL) 25 MG tablet TAKE 2 TABLETS(50 MG) BY MOUTH TWICE DAILY 360 tablet 0  . LOMAIRA 8 mg Tab 1 tablet 30 minutes before meal Orally twice a day for 60 days    . mirabegron (MYRBETRIQ) 50 mg ER tablet Take 1 tablet (50 mg total) by mouth once daily 30 tablet 11  . nystatin (MYCOSTATIN) 100,000 unit/gram ointment APPLY TOPICALLY TO THE AFFECTED AREA TWICE DAILY 60 g 0  . progesterone (PROMETRIUM) 200 MG capsule Take 1 capsule (200 mg total) by mouth once daily 30 capsule 11  . solifenacin (VESICARE) 5 MG tablet Take 1 tablet (5 mg total) by mouth once daily 90 tablet 0  . terbinafine HCL (LAMISIL) 250 mg tablet Take 250 mcg by mouth every other day     No current facility-administered medications for this visit.    SECONDARY CONDITIONS THAT INFLUENCE TREATMENT AND DECISION-MAKING:  Former smoker   Past Medical History: Obesity, seizure disorder, dyspareunia, anxiety, depression, chronic pelvic pain   Past Surgical History: Hysterectomy, cystoscopy, lysis of adhesions for endometriosis, cervical biopsy for HGSIL   Relevant Orthopedic Family History: None  Physical Examination:   BP 120/68  Ht 162.6 cm (5' 4)   Wt 87.1 kg (192 lb)   BMI 32.96 kg/m  General/Constitutional: Well-nourished, well developed, no apparent distress. Psych: Normal mood and affect.  Conversant.  Judgement intact. Musculoskeletal: Comprehensive Hip Exam: Inspection Gait Non-antalgic and fluid      Right Left  Skin Normal appearance with no obvious deformity.  No ecchymosis, erythema, rash, or lesions. Normal appearance with no  obvious deformity.  No ecchymosis, erythema, rash, or lesions.  Atrophy None None    Tenderness                                         Right Left  No bony, muscular, or bursal TTP + greater trochanter, IT band TTP    ROM   Right Left  Flexion/Extension Full to 110 without pain.  Limited extension. Full to 110 with anterior/lateral hip pain.  Limited extension.  90 degree IR Normal IR to 15 degrees without pain Normal IR to 15 degrees with anterior/lateral hip pain  90 degree ER Normal ER to 30 degrees without pain Normal ER to 30 degrees without pain                             Special Tests   Right Left  SLR Negative Negative  FABER Normal Normal  FADIR Negative + anterior/lateral hip pain    Motor   Right Left  Hip Flexion 5/5 5/5  Hip Abduction 4/5 4/5  Knee Extension 5/5 5/5  Knee Flexion 5/5 5/5  Ankle Dorsiflexion 5/5 5/5  Great Toe Extension 5/5 5/5  Ankle Plantarflexion 5/5 5/5  Eversion 5/5 5/5  Inversion 5/5 5/5    Sensory   Right Left  Light Touch Normal light touch sensation Normal light touch sensation    Reflexes   Right Left  Patellar 2+ 2+  Achilles 2+ 2+    Vascular   Right Left  Distal Pulse Normal DP/PT Normal DP/PT    Tests Performed/Ordered:   - Left hip MRI arthrogram = evaluate for labral pathology causing chronic hip pain from impingement syndrome    Tests Previously Reviewed:  EXAM: Left Hip Radiographs - 3 views (AP pelvis, Dunn Lateral, False Profile) performed 05/17/2023   CLINICAL INFORMATION: Chronic left hip pain   COMPARISON: Left Hip Radiographs - 2 views (AP pelvis, Lateral) performed 08/22/2022   FINDINGS:   No fractures or dislocations.  No soft tissue swelling or joint effusion.  Well maintained joint spaces without evidence of degenerative joint disease.  Mild-moderate abnormality to the femoral head-neck geometry could predispose to impingement and labral pathology.  No loose bodies.  No abnormal bone lesions.   Normal-appearing SI joints.  No enthesopathic changes around the hip or pelvis.  Punctate calcification along the superior acetabulum could represent os acetabuli versus labral calcification and was present on previous x-rays. --------------------------------------------------------------------------------------------------------------------- EXAM: Lumbar Spine Radiographs - 2 views (AP, Lateral) performed 08/22/2022   FINDINGS:   There are 5 lumbar type vertebrae without evidence of compression injury.  Normal lumbar lordosis.  No scoliosis.  Grade 1 anterior listhesis of L5 on S1.  No soft tissue swelling.  Well maintained disc spaces.  No abnormal bone lesions.  Normal-appearing SI joints.  I personally reviewed and visualized the imaging studies if available.   Assessment:     ICD-10-CM  1. Pain of  left hip  M25.552  2. Left hip impingement syndrome  M25.852  3. Spondylolisthesis of lumbosacral region  M43.17    Plan:   I have discussed the nature of her current subjective complaints, clinical examination, test results and have reviewed treatment options.  The plan is to do the following;  - The patient has chronic left hip pain of unknown etiology without injury.  I discussed that her symptoms could be due to femoral acetabular impingement leading to underlying labral pathology versus myofascial pain versus tendinitis versus bursitis versus referred pain. - She has pain with hip joint provocative maneuvers which reproduces her symptoms.  No abnormalities on neurologic exam.  There is also some tenderness around her lateral hip and muscles.  Previous x-ray shows abnormal femoral head-neck geometry which would predispose to impingement and labral pathology.  I discussed the possible causes of her symptoms with suspicion for hip joint pathology causing her ongoing pain.  She has done conservative treatment with activity modification, physical therapy including dry needling, home exercise,  ibuprofen, gabapentin with ongoing symptoms.  I explained the next steps in diagnosis and treatment could be intra-articular hip joint steroid injection versus MR arthrogram.  We decided to order an arthrogram.  She has completed significant conservative treatment with ongoing symptoms so I believe this is the best next step for diagnosis. - Activity as tolerated. Modify activity as needed according to symptoms. No limitations to weight bearing.  No need for immobilization or bracing. - Continue home exercise program to maintain strength, flexibility, and endurance. - Use Tylenol, anti-inflammatories, relative rest, massage, and ice/heat as needed for pain.   - Follow up for MRI review.  She could follow-up with myself or Dr. Tobie.  I spent a total of 32 minutes in both face-to-face and non face-to-face activities, excluding procedures performed, for this visit on the date of this encounter which included: Preparation reviewing physical therapy notes, Patient Encounter, Counseling/Educating on possible underlying etiology of symptoms plus treatment moving forward shared decision making, Documentation, and Preparing orders and/or referrals.  Contact our office with any questions or concerns.  Follow up as indicated, or sooner should any new problems arise, if conditions worsen, or if they are otherwise concerned.    Prentice Reges, DO [facility] [address] [place], [place] [address] Phone: [phone]   This note was generated in part with voice recognition software and I apologize for any typographical errors that were not detected and corrected.

## 2023-11-15 ENCOUNTER — Other Ambulatory Visit: Payer: Self-pay | Admitting: Sports Medicine

## 2023-11-15 DIAGNOSIS — M25852 Other specified joint disorders, left hip: Secondary | ICD-10-CM

## 2023-11-15 DIAGNOSIS — M25552 Pain in left hip: Secondary | ICD-10-CM

## 2023-11-16 ENCOUNTER — Ambulatory Visit
Admission: RE | Admit: 2023-11-16 | Discharge: 2023-11-16 | Disposition: A | Discharge status: Home / Self Care | Source: Ambulatory Visit | Attending: Sports Medicine | Admitting: Sports Medicine

## 2023-11-16 DIAGNOSIS — M25552 Pain in left hip: Secondary | ICD-10-CM | POA: Diagnosis present

## 2023-11-16 DIAGNOSIS — M25852 Other specified joint disorders, left hip: Secondary | ICD-10-CM

## 2023-11-16 MED ORDER — LIDOCAINE HCL (PF) 1 % IJ SOLN
10.0000 mL | Freq: Once | INTRAMUSCULAR | Status: AC
  Administered 2023-11-16: 6 mL
  Filled 2023-11-16: qty 10

## 2023-11-16 MED ORDER — IOHEXOL 180 MG/ML  SOLN
20.0000 mL | Freq: Once | INTRAMUSCULAR | Status: AC | PRN
  Administered 2023-11-16: 15 mL

## 2023-11-16 MED ORDER — GADOBUTROL 1 MMOL/ML IV SOLN
0.0500 mL | Freq: Once | INTRAVENOUS | Status: AC | PRN
  Administered 2023-11-16: 0.05 mL
